# Patient Record
Sex: Female | Born: 1955
Health system: Southern US, Community
[De-identification: ages and names within clinical notes are randomized; demographics above are authoritative.]

## PROBLEM LIST (undated history)

## (undated) DIAGNOSIS — R413 Other amnesia: Secondary | ICD-10-CM

## (undated) DIAGNOSIS — E079 Disorder of thyroid, unspecified: Secondary | ICD-10-CM

## (undated) DIAGNOSIS — E039 Hypothyroidism, unspecified: Secondary | ICD-10-CM

## (undated) DIAGNOSIS — F0390 Unspecified dementia without behavioral disturbance: Secondary | ICD-10-CM

## (undated) HISTORY — PX: BREAST LUMPECTOMY: SHX2

## (undated) HISTORY — DX: Hypothyroidism, unspecified: E03.9

## (undated) HISTORY — DX: Disorder of thyroid, unspecified: E07.9

## (undated) HISTORY — DX: Other amnesia: R41.3

## (undated) HISTORY — DX: Unspecified dementia, unspecified severity, without behavioral disturbance, psychotic disturbance, mood disturbance, and anxiety: F03.90

---

## 1998-01-27 ENCOUNTER — Other Ambulatory Visit: Admission: RE | Admit: 1998-01-27 | Discharge: 1998-01-27 | Payer: Self-pay | Admitting: Obstetrics and Gynecology

## 1999-02-20 ENCOUNTER — Other Ambulatory Visit: Admission: RE | Admit: 1999-02-20 | Discharge: 1999-02-20 | Payer: Self-pay | Admitting: Obstetrics and Gynecology

## 2000-05-07 ENCOUNTER — Other Ambulatory Visit: Admission: RE | Admit: 2000-05-07 | Discharge: 2000-05-07 | Payer: Self-pay | Admitting: Obstetrics and Gynecology

## 2001-04-21 ENCOUNTER — Ambulatory Visit (HOSPITAL_COMMUNITY): Admission: RE | Admit: 2001-04-21 | Discharge: 2001-04-21 | Payer: Self-pay | Admitting: Family Medicine

## 2001-04-21 ENCOUNTER — Encounter: Payer: Self-pay | Admitting: Family Medicine

## 2001-07-08 ENCOUNTER — Other Ambulatory Visit: Admission: RE | Admit: 2001-07-08 | Discharge: 2001-07-08 | Payer: Self-pay | Admitting: Obstetrics and Gynecology

## 2002-05-01 ENCOUNTER — Encounter: Payer: Self-pay | Admitting: Family Medicine

## 2002-05-01 ENCOUNTER — Encounter: Admission: RE | Admit: 2002-05-01 | Discharge: 2002-05-01 | Payer: Self-pay | Admitting: Family Medicine

## 2002-08-18 ENCOUNTER — Other Ambulatory Visit: Admission: RE | Admit: 2002-08-18 | Discharge: 2002-08-18 | Payer: Self-pay | Admitting: Obstetrics and Gynecology

## 2004-01-16 LAB — HM COLONOSCOPY: HM Colonoscopy: NORMAL

## 2005-01-01 ENCOUNTER — Emergency Department (HOSPITAL_COMMUNITY): Admission: EM | Admit: 2005-01-01 | Discharge: 2005-01-01 | Payer: Self-pay | Admitting: Emergency Medicine

## 2005-11-13 ENCOUNTER — Ambulatory Visit: Payer: Self-pay | Admitting: Family Medicine

## 2005-11-13 LAB — CONVERTED CEMR LAB: TSH: 0.75 microintl units/mL (ref 0.35–5.50)

## 2006-02-04 ENCOUNTER — Ambulatory Visit: Payer: Self-pay | Admitting: Family Medicine

## 2006-02-04 LAB — CONVERTED CEMR LAB
Cholesterol: 217 mg/dL (ref 0–200)
Direct LDL: 137.7 mg/dL
HDL: 39.5 mg/dL (ref >39.0)
TSH: 0.36 microintl units/mL (ref 0.35–5.50)
Total CHOL/HDL Ratio: 5.5
Triglycerides: 234 mg/dL (ref 0–149)
VLDL: 47 mg/dL — ABNORMAL HIGH (ref 0–40)

## 2006-04-08 ENCOUNTER — Ambulatory Visit: Payer: Self-pay | Admitting: Family Medicine

## 2006-04-08 LAB — CONVERTED CEMR LAB
ALT: 25 units/L (ref 0–40)
AST: 32 units/L (ref 0–37)
Cholesterol: 217 mg/dL (ref 0–200)
Direct LDL: 142.9 mg/dL
HDL: 44 mg/dL (ref >39.0)
Total CHOL/HDL Ratio: 4.9
Triglycerides: 184 mg/dL — ABNORMAL HIGH (ref 0–149)
VLDL: 37 mg/dL (ref 0–40)

## 2007-03-03 ENCOUNTER — Ambulatory Visit: Payer: Self-pay | Admitting: Family Medicine

## 2007-03-03 DIAGNOSIS — M810 Age-related osteoporosis without current pathological fracture: Secondary | ICD-10-CM

## 2007-03-03 DIAGNOSIS — M6530 Trigger finger, unspecified finger: Secondary | ICD-10-CM | POA: Insufficient documentation

## 2007-03-03 DIAGNOSIS — E039 Hypothyroidism, unspecified: Secondary | ICD-10-CM | POA: Insufficient documentation

## 2007-03-04 ENCOUNTER — Ambulatory Visit: Payer: Self-pay | Admitting: Family Medicine

## 2007-03-05 ENCOUNTER — Encounter (INDEPENDENT_AMBULATORY_CARE_PROVIDER_SITE_OTHER): Payer: Self-pay | Admitting: *Deleted

## 2007-03-05 LAB — CONVERTED CEMR LAB: TSH: 0.52 microintl units/mL (ref 0.35–5.50)

## 2007-03-07 ENCOUNTER — Encounter (INDEPENDENT_AMBULATORY_CARE_PROVIDER_SITE_OTHER): Payer: Self-pay | Admitting: Family Medicine

## 2007-03-31 ENCOUNTER — Telehealth (INDEPENDENT_AMBULATORY_CARE_PROVIDER_SITE_OTHER): Payer: Self-pay | Admitting: *Deleted

## 2007-04-04 ENCOUNTER — Encounter: Payer: Self-pay | Admitting: Internal Medicine

## 2007-07-01 ENCOUNTER — Ambulatory Visit (HOSPITAL_BASED_OUTPATIENT_CLINIC_OR_DEPARTMENT_OTHER): Admission: RE | Admit: 2007-07-01 | Discharge: 2007-07-01 | Payer: Self-pay | Admitting: Orthopedic Surgery

## 2007-09-01 ENCOUNTER — Encounter: Payer: Self-pay | Admitting: Family Medicine

## 2008-04-14 ENCOUNTER — Telehealth (INDEPENDENT_AMBULATORY_CARE_PROVIDER_SITE_OTHER): Payer: Self-pay | Admitting: *Deleted

## 2008-09-29 LAB — CONVERTED CEMR LAB: Pap Smear: NORMAL

## 2008-09-29 LAB — HM MAMMOGRAPHY

## 2009-05-09 ENCOUNTER — Telehealth (INDEPENDENT_AMBULATORY_CARE_PROVIDER_SITE_OTHER): Payer: Self-pay | Admitting: *Deleted

## 2009-05-24 ENCOUNTER — Ambulatory Visit: Payer: Self-pay | Admitting: Family Medicine

## 2009-05-30 LAB — CONVERTED CEMR LAB
ALT: 21 units/L (ref 0–35)
BUN: 19 mg/dL (ref 6–23)
Bilirubin, Direct: 0 mg/dL (ref 0.0–0.3)
CO2: 31 meq/L (ref 19–32)
Chloride: 107 meq/L (ref 96–112)
Cholesterol: 250 mg/dL — ABNORMAL HIGH (ref 0–200)
Creatinine, Ser: 0.7 mg/dL (ref 0.4–1.2)
Eosinophils Absolute: 0.2 10*3/uL (ref 0.0–0.7)
Eosinophils Relative: 2.3 % (ref 0.0–5.0)
Free T4: 0.8 ng/dL (ref 0.6–1.6)
Glucose, Bld: 100 mg/dL — ABNORMAL HIGH (ref 70–99)
HCT: 43.4 % (ref 36.0–46.0)
Lymphs Abs: 2.8 10*3/uL (ref 0.7–4.0)
MCHC: 34.8 g/dL (ref 30.0–36.0)
MCV: 94 fL (ref 78.0–100.0)
Monocytes Absolute: 0.6 10*3/uL (ref 0.1–1.0)
Neutrophils Relative %: 54.3 % (ref 43.0–77.0)
Platelets: 254 10*3/uL (ref 150.0–400.0)
Potassium: 5.1 meq/L (ref 3.5–5.1)
RDW: 12.8 % (ref 11.5–14.6)
T3, Free: 2.4 pg/mL (ref 2.3–4.2)
TSH: 1.59 microintl units/mL (ref 0.35–5.50)
Total Bilirubin: 0.5 mg/dL (ref 0.3–1.2)
Total Protein: 6.9 g/dL (ref 6.0–8.3)
VLDL: 38.6 mg/dL (ref 0.0–40.0)
WBC: 7.9 10*3/uL (ref 4.5–10.5)

## 2010-02-16 NOTE — Progress Notes (Signed)
  Phone Note Call from Patient   Caller: Patient Call For: Loreen Freud DO Summary of Call: Spoke with pt and she is going to make appt. Refilled medication for 1 month. Army Fossa CMA  May 09, 2009 4:19 PM     New/Updated Medications: SYNTHROID 100 MCG  TABS (LEVOTHYROXINE SODIUM) Take one tablet daily Prescriptions: SYNTHROID 100 MCG  TABS (LEVOTHYROXINE SODIUM) Take one tablet daily  #30 x 0   Entered by:   Army Fossa CMA   Authorized by:   Loreen Freud DO   Signed by:   Army Fossa CMA on 05/09/2009   Method used:   Electronically to        [facility] [address] (retail)       [phone]       [place], [place]  [address]       Ph: [phone]       Fax: [phone]   RxID:   [id number]

## 2010-02-16 NOTE — Assessment & Plan Note (Signed)
Summary: rto & lab/cbs   Vital Signs:  Patient profile:   55 year old female Height:      61 inches Weight:      135.50 pounds BMI:     25.70 Pulse rate:   76 / minute Pulse rhythm:   regular BP sitting:   118 / 80  (left arm) Cuff size:   regular  Vitals Entered By: Army Fossa CMA (May 24, 2009 9:29 AM) CC: Pt here for follow up and labs   History of Present Illness: Pt here for refill of synthroid only.  NO complaints.  Pt sees Gyn for pap, mammo and bmd.  Preventive Screening-Counseling & Management  Alcohol-Tobacco     Alcohol type: OCASSIONALLY     Smoking Status: never  Caffeine-Diet-Exercise     Does Patient Exercise: yes     Type of exercise: JAZZER SIZE,WEIGHT TRAINING     Times/week: 4  Current Medications (verified): 1)  Evista 60 Mg  Tabs (Raloxifene Hcl) .Marland Kitchen.. 1 By Mouth Once Daily 2)  Fosamax 70 Mg  Tabs (Alendronate Sodium) .Marland Kitchen.. 1 By Mouth Weekly 3)  Synthroid 100 Mcg  Tabs (Levothyroxine Sodium) .... Take One Tablet Daily 4)  Fish Oil 5)  Vitamin D 1000 Unit Tabs (Cholecalciferol)  Allergies (verified): No Known Drug Allergies  Past History:  Past medical, surgical, family and social histories (including risk factors) reviewed for relevance to current acute and chronic problems.  Past Medical History: Reviewed history from 03/03/2007 and no changes required. Hypothyroidism Osteoporosis  Past Surgical History: Reviewed history from 03/03/2007 and no changes required. Caesarean section Lumpectomy  Family History: Reviewed history and no changes required.  Social History: Reviewed history from 03/03/2007 and no changes required. Never Smoked Alcohol use-yes Drug use-no Regular exercise-yes  Review of Systems      See HPI  Physical Exam  General:  Well-developed,well-nourished,in no acute distress; alert,appropriate and cooperative throughout examination Neck:  No deformities, masses, or tenderness noted. Lungs:  Normal  respiratory effort, chest expands symmetrically. Lungs are clear to auscultation, no crackles or wheezes. Heart:  normal rate and no murmur.   Psych:  Oriented X3 and normally interactive.     Impression & Recommendations:  Problem # 1:  HYPOTHYROIDISM (ICD-244.9)  Her updated medication list for this problem includes:    Synthroid 100 Mcg Tabs (Levothyroxine sodium) .Marland Kitchen... Take one tablet daily  Orders: Venipuncture (86578) TLB-Lipid Panel (80061-LIPID) TLB-BMP (Basic Metabolic Panel-BMET) (80048-METABOL) TLB-CBC Platelet - w/Differential (85025-CBCD) TLB-Hepatic/Liver Function Pnl (80076-HEPATIC) TLB-TSH (Thyroid Stimulating Hormone) (84443-TSH) TLB-T3, Free (Triiodothyronine) (84481-T3FREE) TLB-T4 (Thyrox), Free [phone])  Labs Reviewed: TSH: 0.52 (03/04/2007)    Chol: 217 (04/08/2006)   HDL: 44.0 (04/08/2006)   LDL: DEL (04/08/2006)   TG: 184 (04/08/2006)  Complete Medication List: 1)  Evista 60 Mg Tabs (Raloxifene hcl) .Marland Kitchen.. 1 by mouth once daily 2)  Fosamax 70 Mg Tabs (Alendronate sodium) .Marland Kitchen.. 1 by mouth weekly 3)  Synthroid 100 Mcg Tabs (Levothyroxine sodium) .... Take one tablet daily 4)  Fish Oil  5)  Vitamin D 1000 Unit Tabs (Cholecalciferol)  Patient Instructions: 1)  Return for physical  Prescriptions: SYNTHROID 100 MCG  TABS (LEVOTHYROXINE SODIUM) Take one tablet daily Brand medically necessary #30 x 11   Entered and Authorized by:   Loreen Freud DO   Signed by:   Loreen Freud DO on 05/24/2009   Method used:   Print then Give to Patient   RxID:   [id number]   Last Colonoscopy:  normal (01/16/2004  4:23:17 PM) Colonoscopy Result Date:  01/16/2004 Colonoscopy Result:  normal Colonoscopy Next Due:  10 yr PAP Result Date:  09/29/2008 PAP Result:  normal PAP Next Due:  1 yr Mammogram Result Date:  09/29/2008 Mammogram Result:  normal Mammogram Next Due:  1 yr

## 2010-02-21 ENCOUNTER — Encounter: Payer: Self-pay | Admitting: Family Medicine

## 2010-03-02 ENCOUNTER — Telehealth (INDEPENDENT_AMBULATORY_CARE_PROVIDER_SITE_OTHER): Payer: Self-pay | Admitting: *Deleted

## 2010-03-08 NOTE — Progress Notes (Signed)
Summary: Bone Density Results  Phone Note Outgoing Call Call back at Home Phone [phone]   Call placed by: Shonna Chock CMA,  March 02, 2010 4:19 PM Call placed to: Patient Summary of Call: Spoke with patient, all information below ok'd  osteopenic----but much better can stop fosamax ---con't evista and calcium with vita d---recheck 2 years .Shonna Chock CMA  March 02, 2010 4:20 PM

## 2010-05-30 NOTE — Op Note (Signed)
Jennifer Small, Jennifer Small               ACCOUNT NO.:  [id number]   MEDICAL RECORD NO.:  [id number]          PATIENT TYPE:  AMB   LOCATION:  [facility]                          FACILITY:  [facility]   PHYSICIAN:  Cindee Salt, M.D.       DATE OF BIRTH:  [birth year]   DATE OF PROCEDURE:  06/19/2007  DATE OF DISCHARGE:                               OPERATIVE REPORT   PREOPERATIVE DIAGNOSIS:  Stenosing tenosynovitis, right thumb.   POSTOPERATIVE DIAGNOSIS:  Stenosing tenosynovitis, right thumb.   OPERATION:  Release A1 pulley, right thumb.   SURGEON:  Cindee Salt, MD   ASSISTANT:  Carolyne Fiscal, RN   ANESTHESIA:  Forearm-based IV regional.   DATE OF OPERATION:  July 01, 2007.   ANESTHESIOLOGIST:  Zenon Mayo, MD.   HISTORY:  The patient is a 55 year old female with a history of  triggering of her right thumb.  This has not responded to conservative  treatment.  She has elected to undergo release of this.  She is aware of  risks and complications including infection, recurrence, injury to  arteries, nerves, tendons, incomplete relief of symptoms, and dystrophy.  Preoperative area, the patient is seen.  The extremity marked by both  the patient and surgeon.  Antibiotic given.   PROCEDURE:  The patient is brought to the operating room where a forearm-  based IV regional anesthetic was carried out without difficulty along  with sedation.  She was prepped using DuraPrep, supine position, right  arm free.  After time-out was performed, a transverse incision was made  over the A1 pulley of the right thumb, carried down through subcutaneous  tissue.  Bleeders were electrocauterized.  Retractors placed protecting  the radial and ulnar digital nerves to the radial side of the A1 pulley  and an incision was made.  The oblique pulley was left intact.  Pulley  was found to be thickened.  No further lesions were identified.  Thumb  placed through full range motion.  No further triggering was evident.  The  wound was irrigated.  Skin closed with interrupted 4-0 Vicryl Rapide  sutures.  A  sterile compressive dressing was applied.  The patient tolerated the  procedure well and was taken to the recovery room for observation in  satisfactory condition.  She will be discharged to home to return to the  [facility] in 1 week on Vicodin.           ______________________________  Cindee Salt, M.D.     GK/MEDQ  D:  07/01/2007  T:  07/01/2007  Job:  [id number]   cc:   Leanne Chang, M.D.

## 2010-06-01 ENCOUNTER — Other Ambulatory Visit: Payer: Self-pay | Admitting: Family Medicine

## 2010-07-08 ENCOUNTER — Encounter: Payer: Self-pay | Admitting: Family Medicine

## 2010-07-13 ENCOUNTER — Ambulatory Visit (INDEPENDENT_AMBULATORY_CARE_PROVIDER_SITE_OTHER): Payer: BC Managed Care – PPO | Admitting: Family Medicine

## 2010-07-13 ENCOUNTER — Encounter: Payer: Self-pay | Admitting: Family Medicine

## 2010-07-13 VITALS — BP 122/70 | HR 75 | Temp 98.7°F | Wt 136.0 lb

## 2010-07-13 DIAGNOSIS — E039 Hypothyroidism, unspecified: Secondary | ICD-10-CM

## 2010-07-13 LAB — TSH: TSH: 3.79 u[IU]/mL (ref 0.35–5.50)

## 2010-07-13 LAB — T4, FREE: Free T4: 0.8 ng/dL (ref 0.60–1.60)

## 2010-07-13 MED ORDER — SYNTHROID 100 MCG PO TABS: 100.0000 ug | ORAL_TABLET | Freq: Every day | ORAL | Status: DC

## 2010-07-13 NOTE — Progress Notes (Signed)
Addended by: Legrand Como on: 07/13/2010 10:31 AM   Modules accepted: Orders

## 2010-07-13 NOTE — Patient Instructions (Signed)
Hypothyroidism The thyroid is a large gland located in the lower front of your neck. The thyroid gland helps control metabolism. Metabolism is how your body handles food. It controls metabolism with the hormone thyroxine. When this gland is underactive (hypothyroid), it produces too little hormone.  SYMPTOMS OF HYPOTHYROIDISM  Lethargy (feeling as though you have no energy)   Cold intolerance   Weight gain (in spite of normal food intake)   Dry skin   Coarse hair   Menstrual irregularity (if severe, may lead to infertility)   Slowing of thought processes  Cardiac problems are also caused by insufficient amounts of thyroid hormone. Hypothyroidism in the newborn is cretinism, and is an extreme form. It is important that this form be treated adequately and immediately or it will lead rapidly to retarded physical and mental development. CAUSES OF HYPOTHYROIDISM These include:   Absence or destruction of thyroid tissue.  Goiter due to iodine deficiency.   Goiter due to medications.   Congenital defects (since birth).  Problems with the pituitary. This causes a lack of TSH (thyroid stimulating hormone). This hormone tells the thyroid to turn out more hormone.   DIAGNOSIS To prove hypothyroidism, your caregiver may do blood tests and ultrasound tests. Sometimes the signs are hidden. It may be necessary for your caregiver to watch this illness with blood tests either before or after diagnosis and treatment. TREATMENT  Low levels of thyroid hormone are increased by using synthetic thyroid hormone. This is a safe, effective treatment. It usually takes about four weeks to gain the full effects of the medication. After you have the full effect of the medication, it will generally take another four weeks for problems to leave. Your caregiver may start you on low doses. If you have had heart problems the dose may be gradually increased. It is generally not an emergency to get rapidly to  normal. HOME CARE INSTRUCTIONS  Take your medications as your caregiver suggests. Let your caregiver know of any medications you are taking or start taking. Your caregiver will help you with dosage schedules.   As your condition improves, your dosage needs may increase. It will be necessary to have continuing blood tests as suggested by your caregiver.   Report all suspected medication side effects to your caregiver.  SEEK MEDICAL CARE IF YOU DEVELOP:  Sweating.  Tremulousness (tremors).   Anxiety.   Rapid weight loss.   Heat intolerance.  Emotional swings.   Diarrhea.   Weakness.   SEEK IMMEDIATE MEDICAL CARE IF: You develop chest pain, an irregular heart beat (palpitations), or a rapid heart beat. MAKE SURE YOU:   Understand these instructions.   Will watch your condition.   Will get help right away if you are not doing well or get worse.  Document Released: 01/01/2005 Document Re-Released: 12/15/2007 [employer] Patient Information 2011 [employer]. 

## 2010-07-13 NOTE — Progress Notes (Signed)
  Subjective:     Jennifer Small is a 55 y.o. female who presents for follow up of hypothyroidism. Current symptoms: none. Patient denies change in energy level, diarrhea, heat / cold intolerance, nervousness, palpitations and weight changes. Symptoms have been well-controlled.  The following portions of the patient's history were reviewed and updated as appropriate: allergies, current medications, past family history, past medical history, past social history, past surgical history and problem list.  Review of Systems Pertinent items are noted in HPI.    Objective:    BP 122/70  Pulse 75  Temp(Src) 98.7 F (37.1 C) (Oral)  Wt 136 lb (61.689 kg)  SpO2 98% General appearance: alert, cooperative, appears stated age and no distress Neck: no adenopathy, supple, symmetrical, trachea midline and thyroid not enlarged, symmetric, no tenderness/mass/nodules Lungs: clear to auscultation bilaterally Heart: regular rate and rhythm, S1, S2 normal, no murmur, click, rub or gallop Extremities: extremities normal, atraumatic, no cyanosis or edema  Laboratory: Lab Results  Component Value Date   TSH 1.59 05/24/2009      Assessment:    Hypothyroidism.  Replacement.     Plan:    1. L-thyroxine per orders. 2. Recheck thyroid function tests  today. 3. Instructed not to take multivitamins or iron within 4 hours of taking thyroid medications. 4. Follow up in 1 year.

## 2010-08-13 ENCOUNTER — Other Ambulatory Visit: Payer: Self-pay | Admitting: Family Medicine

## 2010-08-14 NOTE — Telephone Encounter (Signed)
30 Day supply

## 2010-09-26 ENCOUNTER — Ambulatory Visit (INDEPENDENT_AMBULATORY_CARE_PROVIDER_SITE_OTHER): Payer: BC Managed Care – PPO | Admitting: Family Medicine

## 2010-09-26 ENCOUNTER — Encounter: Payer: Self-pay | Admitting: Family Medicine

## 2010-09-26 VITALS — BP 122/80 | HR 91 | Temp 98.8°F | Ht 62.0 in | Wt 134.8 lb

## 2010-09-26 DIAGNOSIS — Z0000 Encounter for general adult medical examination without abnormal findings: Secondary | ICD-10-CM

## 2010-09-26 DIAGNOSIS — F411 Generalized anxiety disorder: Secondary | ICD-10-CM

## 2010-09-26 DIAGNOSIS — F419 Anxiety disorder, unspecified: Secondary | ICD-10-CM

## 2010-09-26 MED ORDER — ESCITALOPRAM OXALATE 10 MG PO TABS: 10.0000 mg | ORAL_TABLET | Freq: Every day | ORAL | Status: DC

## 2010-09-26 NOTE — Patient Instructions (Signed)
Anxiety and Panic Attacks Your caregiver has informed you that you are having an anxiety or panic attack. There may be many forms of this. Most of the time these attacks come suddenly and without warning. They come at any time of day, including periods of sleep, and at any time of life. They may be strong and unexplained. Although panic attacks are very scary, they are physically harmless. Sometimes the cause of your anxiety is not known. Anxiety is a protective mechanism of the body in its fight or flight mechanism. Most of these perceived danger situations are actually nonphysical situations (such as anxiety over losing a job). CAUSES The causes of an anxiety or panic attack are many. Panic attacks may occur in otherwise healthy people given a certain set of circumstances. There may be a genetic cause for panic attacks. Some medications may also have anxiety as a side effect. SYMPTOMS Some of the most common feelings are:  Intense terror.  Dizziness, feeling faint.   Hot and cold flashes.   Fear of going crazy.   Feelings that nothing is real.   Sweating.   Shaking.   Chest pain or a fast heartbeat (palpitations).  Smothering, choking sensations.   Feelings of impending doom and that death is near.   Tingling of extremities, this may be from over breathing.   Altered reality (derealization).   Being detached from yourself (depersonalization).   Several symptoms can be present to make up anxiety or panic attacks. DIAGNOSIS The evaluation by your caregiver will depend on the type of symptoms you are experiencing. The diagnosis of anxiety or pain attack is made when no physical illness can be determined to be a cause of the symptoms. TREATMENT Treatment to prevent anxiety and panic attacks may include:  Avoidance of circumstances that cause anxiety.   Reassurance and relaxation.   Regular exercise.   Relaxation therapies, such as yoga.   Psychotherapy with a psychiatrist  or therapist.   Avoidance of caffeine, alcohol and illegal drugs.   Prescribed medication.  SEEK IMMEDIATE MEDICAL CARE IF:  You experience panic attack symptoms that are different than your usual symptoms.   You have any worsening or concerning symptoms.  Document Released: 01/01/2005 Document Re-Released: 06/21/2009 Exit[employer] Patient Information 2011 [employer].Preventative Care for Adults - Female Studies show that half of deaths in the [place] today result from unhealthy lifestyle practices. This includes ignoring preventive care suggestions. Preventive health guidelines for women include the following key practices:  A routine yearly physical is a good way to check with your primary caregiver about your health and preventive screening. It is a chance to share any concerns and updates on your health, and to receive a thorough all-over exam.   If you smoke cigarettes, find out from your caregiver how to quit. It can literally save your life, no matter how long you have been a tobacco user. If you do not use tobacco, never start.   Maintain a healthy diet and normal weight. Increased weight leads to problems with blood pressure and diabetes. Decrease saturated fat in your diet and increase regular exercise. Eat a variety of foods, including fruit, vegetables, animal or vegetable protein (meat, fish, chicken, and eggs, or beans, lentils, and tofu), and grains, such as rice. Get information about proper diet from your caregiver, if needed.   Aerobic exercise helps maintain good heart health. The CDC and the American College of Sports Medicine recommend 30 minutes of moderate-intensity exercise (a brisk walk that increases   your heart rate and breathing) on most days of the week. Ongoing high blood pressure should be treated with medicines, if weight loss and exercise are not effective.   Avoid smoking, drinking too much alcohol (more than two drinks per day), and use of street drugs.  Do not share needles with anyone. Ask for professional help if you need support or instructions about stopping the use of alcohol, cigarettes, or drugs.   Maintain normal blood lipids and cholesterol, by minimizing your intake of saturated fat. Eat a well rounded diet, with plenty of fruit and vegetables. The National Institutes of Health encourage women to eat 5-9 servings of fruit and vegetables each day. Your caregiver can give instructions to help you keep your risk of heart disease or stroke low. High blood pressure causes heart disease and increases risk of stroke. Blood pressure should be checked every 1-2 years, from age 20 onward.   Blood tests for high cholesterol, which causes heart and vessel disease, should begin at age 20 and be repeated every 5 years, if test results are normal. (Repeat tests more often if results are high.)   Diabetes screening involves taking a blood sample to check your blood sugar level, after a fasting period. This is done once every 3 years, after age 45, if test results are normal.   Breast cancer screening is essential to preventive care for women. All women age 20 and older should perform a breast self-exam every month. At age 40 and older, women should have their caregiver complete a breast exam each year. Women at ages 40-50 should have a mammogram (x-ray film) of the breasts each year. Your caregiver can discuss when to start your yearly mammograms.   Cervical cancer screening includes taking a Pap smear (sample of cells examined under a microscope) from the cervix (end of the uterus). It also includes testing for HPV (Human Papilloma Virus, which can cause cervical cancer). Screening and a pelvic exam should begin at age 21, or 3 years after a woman becomes sexually active. Screening should occur every year, with a Pap smear but no HPV testing, up to age 30. After age 30, you should have a Pap smear every 3 years with HPV testing, if no HPV was found previously.    Colon cancer can be detected, and often prevented, long before it is life threatening. Most routine colon cancer screening begins at the age of 50. On a yearly basis, your caregiver may provide easy-to-use take-home tests to check for hidden blood in the stool. Use of a small camera at the end of a tube, to directly examine the colon (sigmoidoscopy or colonoscopy), can detect the earliest forms of colon cancer and can be life saving. Talk to your caregiver about this at age 50, when routine screening begins. (Screening is repeated every 5 years, unless early forms of pre-cancerous polyps or small growths are found.)   Practice safe sex. Use condoms. Condoms are used for birth control and to reduce the spread of sexually transmitted infections (STIs). Unsafe sex is sexual activity without the use of safeguards, such as condoms and avoidance of high-risk acts, to reduce the chances of getting or spreading STIs. STIs include gonorrhea (the clap), chlamydia, syphilis, trichimonas, herpes, HPV (human papilloma virus) and HIV (human immunodeficiency virus), which causes AIDS. Herpes, HIV, and HPV are viral illnesses that have no cure. They can result in disability, cancer, and death.   HPV causes cancer of the cervix, and other infections that can   be transmitted from person to person. There is a vaccine for HPV, and females should get immunized between the ages of 11 and 26. It requires a series of 3 shots.   Osteoporosis is a disease in which the bones lose minerals and strength as we age. This can result in serious bone fractures. Risk of osteoporosis can be identified using a bone density scan. Women ages 65 and over should discuss this with their caregivers, as should women after menopause who have other risk factors. Ask your caregiver whether you should be taking a calcium supplement and Vitamin D, to reduce the rate of osteoporosis.   Menopause can be associated with physical symptoms and risks. Hormone  replacement therapy is available to decrease these. You should talk to your caregiver about whether starting or continuing to take hormones is right for you.   Use sunscreen with SPF (skin protection factor) of 15 or more. Apply sunscreen liberally and repeatedly throughout the day. Being outside in the sun, when your shadow is shorter than you are, means you are being exposed to sun at greater intensity. Lighter skinned people are at a greater risk of skin cancer. Wear sunglasses, to protect your eyes from too much damaging sunlight (which can speed up cataract formation).   Once a month, do a whole body skin exam or review, using a mirror to look at your back. Notify your caregiver of changes in moles, especially if there are changes in shapes, colors, irregular border, a size larger than a pencil eraser, or new moles develop.   Keep carbon monoxide and smoke detectors in your home, and functioning, at all times. Change the batteries every 6 months, or use a model that plugs into the wall.   Stay up to date with your tetanus shots and other required immunizations. A booster for tetanus should be given every 10 years. Be sure to get your flu shot every year, since 5%-20% of the [place] population comes down with the flu. The composition of the flu vaccine changes each year, so being vaccinated once is not enough. Get your shot in the fall, before the flu season peaks. The table below lists important vaccines to get. Other vaccines to consider include for Hepatitis A virus (to prevent a form of infection of the liver, by a virus acquired from food), Varicella Zoster (a virus that causes shingles), and Meninogoccal (against bacteria which cause a form of meningitis).   Brush your teeth twice a day with fluoride toothpaste, and floss once a day. Good oral hygiene prevents tooth decay and gum disease, which can be painful and can cause other health problems. Visit your dentist for a routine oral and dental check  up and preventive care every 6-12 months.   The Body Mass Index or BMI is a way of measuring how much of your body is fat. Having a BMI above 27 increases the risk of heart disease, diabetes, hypertension, stroke and other problems related to obesity. Your caregiver can help determine your BMI, and can develop an exercise and dietary program to help you achieve or maintain this measurement at a healthy level.   Wear seat belts whenever you are in a vehicle, whether as passenger or driver, and even for short drives of a few minutes.   If you bicycle, wear a helmet at all times.  Preventative Care for Adult Women  Preventative Services Ages 19-39 Ages 40-64 Ages 65 and over  Health risk assessment and lifestyle counseling.       Blood pressure check.** Every 1-2 years Every 1-2 years Every 1-2 years  Total cholesterol check including HDL.** Every 5 years beginning at age 35 Every 5 years beginning at age 20, or more often if risk is high Every 5 years through age 75, then optional  Breast self exam. Monthly in all women ages 20 and older Monthly Monthly  Clinical breast exam.** Every 3 years beginning at age 20 Every year Every year  Mammogram.**  Every year beginning at age 50, optional from age 40-49 (discuss with your caregiver). Every year until age 75, then optional  Pap Smear** and HPV Screening. Every year from ages 21 through 29 Every 3 years from ages 30 through 65, if HPV is negative Optional; talk with your caregiver  Flexible sigmoidoscopy** or colonoscopy.**   Every 5 years beginning at age 50 Every 5 years until age 80; then optional  FOBT (fecal occult blood test) of stool.  Every year beginning at age 50 Every year until 80; then optional  Skin self-exam. Monthly Monthly Monthly  Tetanus-diphtheria (Td) immunization. Every 10 years Every 10 years Every 10 years  Influenza immunization.** Every year Every year Every year  HPV immunization. Once between the ages of 11 and 26       Pneumococcal immunization.** Optional Optional Every 5 years  Hepatitis B immunization.** Series of 3 immunizations  (if not done previously, usually given at 0, 1 to 2, and 4 to 6 months)  Check with your caregiver, if vaccination not previously given Check with your caregiver, if vaccination not previously given  ** Family history and personal history of risk and conditions may change your caregiver's recommendations.  Document Released: 02/27/2001 Document Re-Released: 03/28/2009 Exit[employer] Patient Information 2011 [employer]. 

## 2010-09-26 NOTE — Progress Notes (Signed)
Subjective:     Jennifer Small is a 55 y.o. female and is here for a comprehensive physical exam. The patient reports problems - anxiety.  Pt states she was anxious when her husband stayed with his brother after a heart attack.  Pt states she is having a harder time dealing with things and getting things done.  She was constantly worried.    History   Social History  . Marital Status: Married    Spouse Name: N/A    Number of Children: N/A  . Years of Education: N/A   Occupational History  .  [employer]   Social History Main Topics  . Smoking status: Never Smoker   . Smokeless tobacco: Never Used  . Alcohol Use: Yes     rare  . Drug Use: No  . Sexually Active: Yes -- Female partner(s)   Other Topics Concern  . Not on file   Social History Narrative  . No narrative on file   Health Maintenance  Topic Date Due  . Mammogram  09/30/2010  . Influenza Vaccine  10/16/2010  . Pap Smear  12/25/2012  . Colonoscopy  01/15/2014  . Tetanus/tdap  07/13/2019    The following portions of the patient's history were reviewed and updated as appropriate: allergies, current medications, past family history, past medical history, past social history, past surgical history and problem list.  Review of Systems Review of Systems  Constitutional: Negative for activity change, appetite change and fatigue.  HENT: Negative for hearing loss, congestion, tinnitus and ear discharge.  dentist q30m Eyes: Negative for visual disturbance (see optho q1y -- vision corrected to 20/20 with glasses).  Respiratory: Negative for cough, chest tightness and shortness of breath.   Cardiovascular: Negative for chest pain, palpitations and leg swelling.  Gastrointestinal: Negative for abdominal pain, diarrhea, constipation and abdominal distention.  Genitourinary: Negative for urgency, frequency, decreased urine volume and difficulty urinating.  Musculoskeletal: Negative for back pain, arthralgias and gait  problem.  Skin: Negative for color change, pallor and rash.  Neurological: Negative for dizziness, light-headedness, numbness and headaches.  Hematological: Negative for adenopathy. Does not bruise/bleed easily.  Psychiatric/Behavioral: Negative for suicidal ideas, confusion, sleep disturbance, self-injury, dysphoric mood, decreased concentration and agitation. + anxiety     Objective:    BP 122/80  Pulse 91  Temp(Src) 98.8 F (37.1 C) (Oral)  Ht 5\' 2"  (1.575 m)  Wt 134 lb 12.8 oz (61.145 kg)  BMI 24.66 kg/m2  SpO2 98% General appearance: alert, cooperative, appears stated age and no distress Head: Normocephalic, without obvious abnormality, atraumatic Eyes: conjunctivae/corneas clear. PERRL, EOM&#39;s intact. Fundi benign. Ears: normal TM's and external ear canals both ears Nose: Nares normal. Septum midline. Mucosa normal. No drainage or sinus tenderness. Throat: lips, mucosa, and tongue normal; teeth and gums normal Neck: no adenopathy, no carotid bruit, no JVD, supple, symmetrical, trachea midline and thyroid not enlarged, symmetric, no tenderness/mass/nodules Back: symmetric, no curvature. ROM normal. No CVA tenderness. Lungs: clear to auscultation bilaterally Breasts: normal appearance, no masses or tenderness Heart: regular rate and rhythm, S1, S2 normal, no murmur, click, rub or gallop Abdomen: soft, non-tender; bowel sounds normal; no masses,  no organomegaly Pelvic: deferred until dec Extremities: extremities normal, atraumatic, no cyanosis or edema Pulses: 2+ and symmetric Skin: Skin color, texture, turgor normal. No rashes or lesions Lymph nodes: Cervical, supraclavicular, and axillary nodes normal. Neurologic: Alert and oriented X 3, normal strength and tone. Normal symmetric reflexes. Normal coordination and gait   psych-- +  fidgety and anxious Assessment:    Healthy female exam.  Anxiety--- start Lexapro 10 mg qd  osteoporosis   Plan:  ghm utd Pap not due  until dec Schedule mammo rto 1 month or sooner prn   See After Visit Summary for Counseling Recommendations

## 2010-10-12 LAB — POCT HEMOGLOBIN-HEMACUE: Hemoglobin: 16 — ABNORMAL HIGH

## 2010-10-26 ENCOUNTER — Encounter: Payer: Self-pay | Admitting: Family Medicine

## 2010-10-26 ENCOUNTER — Ambulatory Visit: Payer: BC Managed Care – PPO | Admitting: Family Medicine

## 2010-10-26 DIAGNOSIS — Z0289 Encounter for other administrative examinations: Secondary | ICD-10-CM

## 2011-01-15 ENCOUNTER — Encounter: Payer: Self-pay | Admitting: Family Medicine

## 2011-01-24 ENCOUNTER — Other Ambulatory Visit: Payer: Self-pay | Admitting: Family Medicine

## 2011-02-24 ENCOUNTER — Other Ambulatory Visit: Payer: Self-pay | Admitting: Family Medicine

## 2011-04-11 ENCOUNTER — Other Ambulatory Visit: Payer: Self-pay

## 2011-04-11 MED ORDER — RALOXIFENE HCL 60 MG PO TABS: 60.0000 mg | ORAL_TABLET | Freq: Every day | ORAL | Status: DC

## 2011-06-18 ENCOUNTER — Encounter: Payer: Self-pay | Admitting: Family Medicine

## 2011-06-18 ENCOUNTER — Ambulatory Visit (INDEPENDENT_AMBULATORY_CARE_PROVIDER_SITE_OTHER): Payer: BC Managed Care – PPO | Admitting: Family Medicine

## 2011-06-18 VITALS — BP 122/80 | HR 93 | Temp 98.0°F | Ht 61.0 in | Wt 143.8 lb

## 2011-06-18 DIAGNOSIS — R413 Other amnesia: Secondary | ICD-10-CM

## 2011-06-18 DIAGNOSIS — Z0000 Encounter for general adult medical examination without abnormal findings: Secondary | ICD-10-CM

## 2011-06-18 DIAGNOSIS — F0390 Unspecified dementia without behavioral disturbance: Secondary | ICD-10-CM

## 2011-06-18 NOTE — Patient Instructions (Signed)
Dementia  Dementia is a general term for problems with brain function. A person with dementia has memory loss and a hard time with at least one other brain function such as thinking, speaking, or problem solving. Dementia can affect social functioning, how you do your job, your mood, or your personality. The changes may be hidden for a long time. The earliest forms of this disease are usually not detected by family or friends.  Dementia can be:   Irreversible.   Potentially reversible.   Partially reversible.   Progressive. This means it can get worse over time.  CAUSES   Irreversible dementia causes may include:   Degeneration of brain cells (Alzheimer's disease or lewy body dementia).   Multiple small strokes (vascular dementia).   Infection (chronic meningitis or Creutzfelt-Jakob disease).   Frontotemporal dementia. This affects younger people, age 40 to 70, compared to those who have Alzheimer's disease.   Dementia associated with other disorders like Parkinson's disease, Huntington's disease, or HIV-associated dementia.  Potentially or partially reversible dementia causes may include:   Medicines.   Metabolic causes such as excessive alcohol intake, vitamin B12 deficiency, or thyroid disease.   Masses or pressure in the brain such as a tumor, blood clot, or hydrocephalus.  SYMPTOMS   Symptoms are often hard to detect. Family members or coworkers may not notice them early in the disease process. Different people with dementia may have different symptoms. Symptoms can include:   A hard time with memory, especially recent memory. Long-term memory may not be impaired.   Asking the same question multiple times or forgetting something someone just said.   A hard time speaking your thoughts or finding certain words.   A hard time solving problems or performing familiar tasks (such as how to use a telephone).   Sudden changes in mood.   Changes in personality, especially increasing moodiness or  mistrust.   Depression.   A hard time understanding complex ideas that were never a problem in the past.  DIAGNOSIS   There are no specific tests for dementia.    Your caregiver may recommend a thorough evaluation. This is because some forms of dementia can be reversible. The evaluation will likely include a physical exam and getting a detailed history from you and a family member. The history often gives the best clues and suggestions for a diagnosis.   Memory testing may be done. A detailed brain function evaluation called neuropsychologic testing may be helpful.   Lab tests and brain imaging (such as a CT scan or MRI scan) are sometimes important.   Sometimes observation and re-evaluation over time is very helpful.  TREATMENT   Treatment depends on the cause.    If the problem is a vitamin deficiency, it may be helped or cured with supplements.   For dementias such as Alzheimer's disease, medicines are available to stabilize or slow the course of the disease. There are no cures for this type of dementia.   Your caregiver can help direct you to groups, organizations, and other caregivers to help with decisions in the care of you or your loved one.  HOME CARE INSTRUCTIONS  The care of individuals with dementia is varied and dependent upon the progression of the dementia. The following suggestions are intended for the person living with, or caring for, the person with dementia.   Create a safe environment.   Remove the locks on bathroom doors to prevent the person from accidentally locking himself or herself in.     Use childproof latches on kitchen cabinets and any place where cleaning supplies, chemicals, or alcohol are kept.   Use childproof covers in unused electrical outlets.   Install childproof devices to keep doors and windows secured.   Remove stove knobs or install safety knobs and an automatic shut-off on the stove.   Lower the temperature on water heaters.   Label medicines and keep them  locked up.   Secure knives, lighters, matches, power tools, and guns, and keep these items out of reach.   Keep the house free from clutter. Remove rugs or anything that might contribute to a fall.   Remove objects that might break and hurt the person.   Make sure lighting is good, both inside and outside.   Install grab rails as needed.   Use a monitoring device to alert you to falls or other needs for help.   Reduce confusion.   Keep familiar objects and people around.   Use night lights or dim lights at night.   Label items or areas.   Use reminders, notes, or directions for daily activities or tasks.   Keep a simple, consistent routine for waking, meals, bathing, dressing, and bedtime.   Create a calm, quiet environment.   Place large clocks and calendars prominently.   Display emergency numbers and home address near all telephones.   Use cues to establish different times of the day. An example is to open curtains to let the natural light in during the day.    Use effective communication.   Choose simple words and short sentences.   Use a gentle, calm tone of voice.   Be careful not to interrupt.   If the person is struggling to find a word or communicate a thought, try to provide the word or thought.   Ask one question at a time. Allow the person ample time to answer questions. Repeat the question again if the person does not respond.   Reduce nighttime restlessness.   Provide a comfortable bed.   Have a consistent nighttime routine.   Ensure a regular walking or physical activity schedule. Involve the person in daily activities as much as possible.   Limit napping during the day.   Limit caffeine.   Attend social events that stimulate rather than overwhelm the senses.   Encourage good nutrition and hydration.   Reduce distractions during meal times and snacks.   Avoid foods that are too hot or too cold.   Monitor chewing and swallowing ability.   Continue with routine vision,  hearing, dental, and medical screenings.   Only give over-the-counter or prescription medicines as directed by the caregiver.   Monitor driving abilities. Do not allow the person to drive when safe driving is no longer possible.   Register with an identification program which could provide location assistance in the event of a missing person situation.  SEEK MEDICAL CARE IF:    New behavioral problems start such as moodiness, aggressiveness, or seeing things that are not there (hallucinations).   Any new problem with brain function happens. This includes problems with balance, speech, or falling a lot.   Problems with swallowing develop.   Any symptoms of other illness happen.  Small changes or worsening in any aspect of brain function can be a sign that the illness is getting worse. It can also be a sign of another medical illness such as infection. Seeing a caregiver right away is important.  SEEK IMMEDIATE MEDICAL CARE IF:      A fever develops.   New or worsened confusion develops.   New or worsened sleepiness develops.   Staying awake becomes hard to do.  Document Released: 06/27/2000 Document Revised: 12/21/2010 Document Reviewed: 05/29/2010  Exit[employer] Patient Information 2012 [employer].

## 2011-06-18 NOTE — Progress Notes (Signed)
  Subjective:    Patient ID: Jennifer Small, female    DOB: Apr 02, 1955, 56 y.o.   MRN: [id number]  HPI  Pt is here with her husband who is concerned because she has been having memory issues the last 1 -  1 1/2 years.  It seems to be progressively worse.    Review of Systems    as above Objective:   Physical Exam  Constitutional: She appears well-developed. No distress.  Neck: Normal range of motion. Neck supple. No thyromegaly present.  Cardiovascular: Normal rate, regular rhythm and normal heart sounds.   Pulmonary/Chest: Effort normal and breath sounds normal. No respiratory distress. She has no wheezes. She has no rales. She exhibits no tenderness.  Abdominal: Soft. Bowel sounds are normal.  Musculoskeletal: She exhibits no edema and no tenderness.  Lymphadenopathy:    She has no cervical adenopathy.  Neurological: She is alert.       MMSE  21/30  Skin: She is not diaphoretic.  Psychiatric: She has a normal mood and affect. Her behavior is normal.          Assessment & Plan:

## 2011-06-18 NOTE — Assessment & Plan Note (Signed)
Check labs Check MRI Consider Neuro

## 2011-06-19 LAB — BASIC METABOLIC PANEL
Calcium: 9.8 mg/dL (ref 8.4–10.5)
Chloride: 107 mEq/L (ref 96–112)
Creatinine, Ser: 0.9 mg/dL (ref 0.4–1.2)
GFR: 70.07 mL/min (ref >60.00)

## 2011-06-19 LAB — HEPATIC FUNCTION PANEL
ALT: 33 U/L (ref 0–35)
AST: 40 U/L — ABNORMAL HIGH (ref 0–37)
Albumin: 4.2 g/dL (ref 3.5–5.2)
Alkaline Phosphatase: 88 U/L (ref 39–117)
Total Protein: 7.7 g/dL (ref 6.0–8.3)

## 2011-06-19 LAB — LIPID PANEL
Cholesterol: 285 mg/dL — ABNORMAL HIGH (ref 0–200)
VLDL: 44.8 mg/dL — ABNORMAL HIGH (ref 0.0–40.0)

## 2011-06-19 LAB — TSH: TSH: 14.7 u[IU]/mL — ABNORMAL HIGH (ref 0.35–5.50)

## 2011-06-19 NOTE — Progress Notes (Signed)
Addended by: Silvio Pate D on: 06/19/2011 08:46 AM   Modules accepted: Orders

## 2011-06-20 NOTE — Progress Notes (Signed)
Addended by: Silvio Pate D on: 06/20/2011 03:22 PM   Modules accepted: Orders

## 2011-06-21 LAB — CBC WITH DIFFERENTIAL/PLATELET
Eosinophils Absolute: 0.2 10*3/uL (ref 0.0–0.7)
HCT: 45.1 % (ref 36.0–46.0)
Lymphs Abs: 3.5 10*3/uL (ref 0.7–4.0)
MCHC: 33.5 g/dL (ref 30.0–36.0)
MCV: 96.1 fl (ref 78.0–100.0)
Monocytes Absolute: 0.6 10*3/uL (ref 0.1–1.0)
Neutrophils Relative %: 55.1 % (ref 43.0–77.0)
Platelets: 242 10*3/uL (ref 150.0–400.0)

## 2011-06-21 LAB — BASIC METABOLIC PANEL
BUN: 16 mg/dL (ref 6–23)
Calcium: 9.5 mg/dL (ref 8.4–10.5)
GFR: 67.41 mL/min (ref >60.00)
Potassium: 4 mEq/L (ref 3.5–5.1)
Sodium: 142 mEq/L (ref 135–145)

## 2011-07-04 DIAGNOSIS — E039 Hypothyroidism, unspecified: Secondary | ICD-10-CM

## 2011-07-04 MED ORDER — LEVOTHYROXINE SODIUM 112 MCG PO TABS
112.0000 ug | ORAL_TABLET | Freq: Every day | ORAL | Status: DC
Start: 2011-07-04 — End: 2011-10-02

## 2011-07-04 MED ORDER — ATORVASTATIN CALCIUM 10 MG PO TABS: 10.0000 mg | ORAL_TABLET | Freq: Every day | ORAL | Status: DC

## 2011-07-07 ENCOUNTER — Ambulatory Visit (HOSPITAL_BASED_OUTPATIENT_CLINIC_OR_DEPARTMENT_OTHER)
Admission: RE | Admit: 2011-07-07 | Discharge: 2011-07-07 | Disposition: A | Discharge status: Home / Self Care | Payer: BC Managed Care – PPO | Source: Ambulatory Visit | Attending: Family Medicine | Admitting: Family Medicine

## 2011-07-07 DIAGNOSIS — J329 Chronic sinusitis, unspecified: Secondary | ICD-10-CM | POA: Insufficient documentation

## 2011-07-07 DIAGNOSIS — F0390 Unspecified dementia without behavioral disturbance: Secondary | ICD-10-CM | POA: Insufficient documentation

## 2011-07-25 ENCOUNTER — Ambulatory Visit: Payer: BC Managed Care – PPO | Admitting: Family Medicine

## 2011-07-31 ENCOUNTER — Ambulatory Visit: Payer: BC Managed Care – PPO | Admitting: Family Medicine

## 2011-08-03 ENCOUNTER — Encounter: Payer: Self-pay | Admitting: Family Medicine

## 2011-08-03 ENCOUNTER — Ambulatory Visit (INDEPENDENT_AMBULATORY_CARE_PROVIDER_SITE_OTHER): Payer: BC Managed Care – PPO | Admitting: Family Medicine

## 2011-08-03 VITALS — BP 120/80 | HR 102 | Temp 98.4°F | Wt 142.6 lb

## 2011-08-03 DIAGNOSIS — E039 Hypothyroidism, unspecified: Secondary | ICD-10-CM

## 2011-08-03 DIAGNOSIS — E785 Hyperlipidemia, unspecified: Secondary | ICD-10-CM

## 2011-08-03 NOTE — Progress Notes (Signed)
  Subjective:    Patient ID: Jennifer Small, female    DOB: [birth year], 55 y.o.   MRN: [id number]  HPI Pt here with her husband.  She is doing a little better but still is having issues per her husband. Pt is frustrated because she doesn't realize there is a problem.     Review of Systems As above    Objective:   Physical Exam  Constitutional: She appears well-developed and well-nourished.  Neurological: She is alert.  Psychiatric: She has a normal mood and affect. Her behavior is normal. Judgment and thought content normal.          Assessment & Plan:  Memory loss---  ? Secondary to thyroid---  Recheck TSH in 1 month Hyperlipidemia---on  lipitor

## 2011-09-03 ENCOUNTER — Other Ambulatory Visit (INDEPENDENT_AMBULATORY_CARE_PROVIDER_SITE_OTHER): Payer: BC Managed Care – PPO

## 2011-09-03 DIAGNOSIS — E039 Hypothyroidism, unspecified: Secondary | ICD-10-CM

## 2011-09-03 DIAGNOSIS — E785 Hyperlipidemia, unspecified: Secondary | ICD-10-CM

## 2011-09-03 LAB — LIPID PANEL
Cholesterol: 193 mg/dL (ref 0–200)
LDL Cholesterol: 106 mg/dL — ABNORMAL HIGH (ref 0–99)
Total CHOL/HDL Ratio: 4
Triglycerides: 189 mg/dL — ABNORMAL HIGH (ref 0.0–149.0)
VLDL: 37.8 mg/dL (ref 0.0–40.0)

## 2011-09-03 LAB — HEPATIC FUNCTION PANEL
Albumin: 4.2 g/dL (ref 3.5–5.2)
Alkaline Phosphatase: 80 U/L (ref 39–117)
Total Protein: 7.1 g/dL (ref 6.0–8.3)

## 2011-09-07 ENCOUNTER — Ambulatory Visit: Payer: BC Managed Care – PPO | Admitting: Family Medicine

## 2011-09-18 ENCOUNTER — Encounter: Payer: Self-pay | Admitting: Family Medicine

## 2011-09-18 ENCOUNTER — Ambulatory Visit (INDEPENDENT_AMBULATORY_CARE_PROVIDER_SITE_OTHER): Payer: BC Managed Care – PPO | Admitting: Family Medicine

## 2011-09-18 VITALS — BP 120/76 | HR 87 | Temp 98.4°F | Wt 143.4 lb

## 2011-09-18 DIAGNOSIS — E039 Hypothyroidism, unspecified: Secondary | ICD-10-CM

## 2011-09-18 DIAGNOSIS — E785 Hyperlipidemia, unspecified: Secondary | ICD-10-CM | POA: Insufficient documentation

## 2011-09-18 DIAGNOSIS — R413 Other amnesia: Secondary | ICD-10-CM

## 2011-09-18 MED ORDER — DONEPEZIL HCL 5 MG PO TABS: 5.0000 mg | ORAL_TABLET | Freq: Every evening | ORAL | Status: DC | PRN

## 2011-09-18 MED ORDER — FENOFIBRATE 160 MG PO TABS: 160.0000 mg | ORAL_TABLET | Freq: Every day | ORAL | Status: DC

## 2011-09-18 NOTE — Patient Instructions (Addendum)
Dementia Dementia is a general term for problems with brain function. A person with dementia has memory loss and a hard time with at least one other brain function such as thinking, speaking, or problem solving. Dementia can affect social functioning, how you do your job, your mood, or your personality. The changes may be hidden for a long time. The earliest forms of this disease are usually not detected by family or friends. Dementia can be:  Irreversible.   Potentially reversible.   Partially reversible.   Progressive. This means it can get worse over time.  CAUSES  Irreversible dementia causes may include:  Degeneration of brain cells (Alzheimer's disease or lewy body dementia).   Multiple small strokes (vascular dementia).   Infection (chronic meningitis or Creutzfelt-Jakob disease).   Frontotemporal dementia. This affects younger people, age 35 to 71, compared to those who have Alzheimer's disease.   Dementia associated with other disorders like Parkinson's disease, Huntington's disease, or HIV-associated dementia.  Potentially or partially reversible dementia causes may include:  Medicines.   Metabolic causes such as excessive alcohol intake, vitamin B12 deficiency, or thyroid disease.   Masses or pressure in the brain such as a tumor, blood clot, or hydrocephalus.  SYMPTOMS  Symptoms are often hard to detect. Family members or coworkers may not notice them early in the disease process. Different people with dementia may have different symptoms. Symptoms can include:  A hard time with memory, especially recent memory. Long-term memory may not be impaired.   Asking the same question multiple times or forgetting something someone just said.   A hard time speaking your thoughts or finding certain words.   A hard time solving problems or performing familiar tasks (such as how to use a telephone).   Sudden changes in mood.   Changes in personality, especially increasing  moodiness or mistrust.   Depression.   A hard time understanding complex ideas that were never a problem in the past.  DIAGNOSIS  There are no specific tests for dementia.   Your caregiver may recommend a thorough evaluation. This is because some forms of dementia can be reversible. The evaluation will likely include a physical exam and getting a detailed history from you and a family member. The history often gives the best clues and suggestions for a diagnosis.   Memory testing may be done. A detailed brain function evaluation called neuropsychologic testing may be helpful.   Lab tests and brain imaging (such as a CT scan or MRI scan) are sometimes important.   Sometimes observation and re-evaluation over time is very helpful.  TREATMENT  Treatment depends on the cause.   If the problem is a vitamin deficiency, it may be helped or cured with supplements.   For dementias such as Alzheimer's disease, medicines are available to stabilize or slow the course of the disease. There are no cures for this type of dementia.   Your caregiver can help direct you to groups, organizations, and other caregivers to help with decisions in the care of you or your loved one.  HOME CARE INSTRUCTIONS The care of individuals with dementia is varied and dependent upon the progression of the dementia. The following suggestions are intended for the person living with, or caring for, the person with dementia.  Create a safe environment.   Remove the locks on bathroom doors to prevent the person from accidentally locking himself or herself in.   Use childproof latches on kitchen cabinets and any place where cleaning supplies, chemicals,  or alcohol are kept.   Use childproof covers in unused electrical outlets.   Install childproof devices to keep doors and windows secured.   Remove stove knobs or install safety knobs and an automatic shut-off on the stove.   Lower the temperature on water heaters.    Label medicines and keep them locked up.   Secure knives, lighters, matches, power tools, and guns, and keep these items out of reach.   Keep the house free from clutter. Remove rugs or anything that might contribute to a fall.   Remove objects that might break and hurt the person.   Make sure lighting is good, both inside and outside.   Install grab rails as needed.   Use a monitoring device to alert you to falls or other needs for help.   Reduce confusion.   Keep familiar objects and people around.   Use night lights or dim lights at night.   Label items or areas.   Use reminders, notes, or directions for daily activities or tasks.   Keep a simple, consistent routine for waking, meals, bathing, dressing, and bedtime.   Create a calm, quiet environment.   Place large clocks and calendars prominently.   Display emergency numbers and home address near all telephones.   Use cues to establish different times of the day. An example is to open curtains to let the natural light in during the day.    Use effective communication.   Choose simple words and short sentences.   Use a gentle, calm tone of voice.   Be careful not to interrupt.   If the person is struggling to find a word or communicate a thought, try to provide the word or thought.   Ask one question at a time. Allow the person ample time to answer questions. Repeat the question again if the person does not respond.   Reduce nighttime restlessness.   Provide a comfortable bed.   Have a consistent nighttime routine.   Ensure a regular walking or physical activity schedule. Involve the person in daily activities as much as possible.   Limit napping during the day.   Limit caffeine.   Attend social events that stimulate rather than overwhelm the senses.   Encourage good nutrition and hydration.   Reduce distractions during meal times and snacks.   Avoid foods that are too hot or too cold.    Monitor chewing and swallowing ability.   Continue with routine vision, hearing, dental, and medical screenings.   Only give over-the-counter or prescription medicines as directed by the caregiver.   Monitor driving abilities. Do not allow the person to drive when safe driving is no longer possible.   Register with an identification program which could provide location assistance in the event of a missing person situation.  SEEK MEDICAL CARE IF:   New behavioral problems start such as moodiness, aggressiveness, or seeing things that are not there (hallucinations).   Any new problem with brain function happens. This includes problems with balance, speech, or falling a lot.   Problems with swallowing develop.   Any symptoms of other illness happen.  Small changes or worsening in any aspect of brain function can be a sign that the illness is getting worse. It can also be a sign of another medical illness such as infection. Seeing a caregiver right away is important. SEEK IMMEDIATE MEDICAL CARE IF:   A fever develops.   New or worsened confusion develops.   New or worsened   sleepiness develops.   Staying awake becomes hard to do.  Document Released: 06/27/2000 Document Revised: 12/21/2010 Document Reviewed: 05/29/2010 [facility] Patient Information 2012 [place], [place].  Hypertriglyceridemia  Diet for High blood levels of Triglycerides Most fats in food are triglycerides. Triglycerides in your blood are stored as fat in your body. High levels of triglycerides in your blood may put you at a greater risk for heart disease and stroke.  Normal triglyceride levels are less than 150 mg/dL. Borderline high levels are 150-199 mg/dl. High levels are 200 - 499 mg/dL, and very high triglyceride levels are greater than 500 mg/dL. The decision to treat high triglycerides is generally based on the level. For people with borderline or high triglyceride levels, treatment includes weight loss and  exercise. Drugs are recommended for people with very high triglyceride levels. Many people who need treatment for high triglyceride levels have metabolic syndrome. This syndrome is a collection of disorders that often include: insulin resistance, high blood pressure, blood clotting problems, high cholesterol and triglycerides. TESTING PROCEDURE FOR TRIGLYCERIDES  You should not eat 4 hours before getting your triglycerides measured. The normal range of triglycerides is between 10 and 250 milligrams per deciliter (mg/dl). Some people may have extreme levels (1000 or above), but your triglyceride level may be too high if it is above 150 mg/dl, depending on what other risk factors you have for heart disease.   People with high blood triglycerides may also have high blood cholesterol levels. If you have high blood cholesterol as well as high blood triglycerides, your risk for heart disease is probably greater than if you only had high triglycerides. High blood cholesterol is one of the main risk factors for heart disease.  CHANGING YOUR DIET  Your weight can affect your blood triglyceride level. If you are more than 20% above your ideal body weight, you may be able to lower your blood triglycerides by losing weight. Eating less and exercising regularly is the best way to combat this. Fat provides more calories than any other food. The best way to lose weight is to eat less fat. Only 30% of your total calories should come from fat. Less than 7% of your diet should come from saturated fat. A diet low in fat and saturated fat is the same as a diet to decrease blood cholesterol. By eating a diet lower in fat, you may lose weight, lower your blood cholesterol, and lower your blood triglyceride level.  Eating a diet low in fat, especially saturated fat, may also help you lower your blood triglyceride level. Ask your dietitian to help you figure how much fat you can eat based on the number of calories your caregiver  has prescribed for you.  Exercise, in addition to helping with weight loss may also help lower triglyceride levels.   Alcohol can increase blood triglycerides. You may need to stop drinking alcoholic beverages.   Too much carbohydrate in your diet may also increase your blood triglycerides. Some complex carbohydrates are necessary in your diet. These may include bread, rice, potatoes, other starchy vegetables and cereals.   Reduce "simple" carbohydrates. These may include pure sugars, candy, honey, and jelly without losing other nutrients. If you have the kind of high blood triglycerides that is affected by the amount of carbohydrates in your diet, you will need to eat less sugar and less high-sugar foods. Your caregiver can help you with this.   Adding 2-4 grams of fish oil (EPA+ DHA) may also help lower triglycerides. Speak  with your caregiver before adding any supplements to your regimen.  Following the Diet  Maintain your ideal weight. Your caregivers can help you with a diet. Generally, eating less food and getting more exercise will help you lose weight. Joining a weight control group may also help. Ask your caregivers for a good weight control group in your area.  Eat low-fat foods instead of high-fat foods. This can help you lose weight too.  These foods are lower in fat. Eat MORE of these:   Dried beans, peas, and lentils.   Egg whites.   Low-fat cottage cheese.   Fish.   Lean cuts of meat, such as round, sirloin, rump, and flank (cut extra fat off meat you fix).   Whole grain breads, cereals and pasta.   Skim and nonfat dry milk.   Low-fat yogurt.   Poultry without the skin.   Cheese made with skim or part-skim milk, such as mozzarella, parmesan, farmers', ricotta, or pot cheese.  These are higher fat foods. Eat LESS of these:   Whole milk and foods made from whole milk, such as American, blue, cheddar, monterey jack, and swiss cheese   High-fat meats, such as luncheon  meats, sausages, knockwurst, bratwurst, hot dogs, ribs, corned beef, ground pork, and regular ground beef.   Fried foods.  Limit saturated fats in your diet. Substituting unsaturated fat for saturated fat may decrease your blood triglyceride level. You will need to read package labels to know which products contain saturated fats.  These foods are high in saturated fat. Eat LESS of these:   Fried pork skins.   Whole milk.   Skin and fat from poultry.   Palm oil.   Butter.   Shortening.   Cream cheese.   Tomasa Blase.   Margarines and baked goods made from listed oils.   Vegetable shortenings.   Chitterlings.   Fat from meats.   Coconut oil.   Palm kernel oil.   Lard.   Cream.   Sour cream.   Fatback.   Coffee whiteners and non-dairy creamers made with these oils.   Cheese made from whole milk.  Use unsaturated fats (both polyunsaturated and monounsaturated) moderately. Remember, even though unsaturated fats are better than saturated fats; you still want a diet low in total fat.  These foods are high in unsaturated fat:   Canola oil.   Sunflower oil.   Mayonnaise.   Almonds.   Peanuts.   Pine nuts.   Margarines made with these oils.   Safflower oil.   Olive oil.   Avocados.   Cashews.   Peanut butter.   Sunflower seeds.   Soybean oil.   Peanut oil.   Olives.   Pecans.   Walnuts.   Pumpkin seeds.  Avoid sugar and other high-sugar foods. This will decrease carbohydrates without decreasing other nutrients. Sugar in your food goes rapidly to your blood. When there is excess sugar in your blood, your liver may use it to make more triglycerides. Sugar also contains calories without other important nutrients.  Eat LESS of these:   Sugar, brown sugar, powdered sugar, jam, jelly, preserves, honey, syrup, molasses, pies, candy, cakes, cookies, frosting, pastries, colas, soft drinks, punches, fruit drinks, and regular gelatin.   Avoid alcohol.  Alcohol, even more than sugar, may increase blood triglycerides. In addition, alcohol is high in calories and low in nutrients. Ask for sparkling water, or a diet soft drink instead of an alcoholic beverage.  Suggestions for planning and preparing meals  Bake, broil, grill or roast meats instead of frying.   Remove fat from meats and skin from poultry before cooking.   Add spices, herbs, lemon juice or vinegar to vegetables instead of salt, rich sauces or gravies.   Use a non-stick skillet without fat or use no-stick sprays.   Cool and refrigerate stews and broth. Then remove the hardened fat floating on the surface before serving.   Refrigerate meat drippings and skim off fat to make low-fat gravies.   Serve more fish.   Use less butter, margarine and other high-fat spreads on bread or vegetables.   Use skim or reconstituted non-fat dry milk for cooking.   Cook with low-fat cheeses.   Substitute low-fat yogurt or cottage cheese for all or part of the sour cream in recipes for sauces, dips or congealed salads.   Use half yogurt/half mayonnaise in salad recipes.   Substitute evaporated skim milk for cream. Evaporated skim milk or reconstituted non-fat dry milk can be whipped and substituted for whipped cream in certain recipes.   Choose fresh fruits for dessert instead of high-fat foods such as pies or cakes. Fruits are naturally low in fat.  When Dining Out   Order low-fat appetizers such as fruit or vegetable juice, pasta with vegetables or tomato sauce.   Select clear, rather than cream soups.   Ask that dressings and gravies be served on the side. Then use less of them.   Order foods that are baked, broiled, poached, steamed, stir-fried, or roasted.   Ask for margarine instead of butter, and use only a small amount.   Drink sparkling water, unsweetened tea or coffee, or diet soft drinks instead of alcohol or other sweet beverages.  QUESTIONS AND ANSWERS ABOUT OTHER FATS  IN THE BLOOD: SATURATED FAT, TRANS FAT, AND CHOLESTEROL What is trans fat? Trans fat is a type of fat that is formed when vegetable oil is hardened through a process called hydrogenation. This process helps makes foods more solid, gives them shape, and prolongs their shelf life. Trans fats are also called hydrogenated or partially hydrogenated oils.  What do saturated fat, trans fat, and cholesterol in foods have to do with heart disease? Saturated fat, trans fat, and cholesterol in the diet all raise the level of LDL "bad" cholesterol in the blood. The higher the LDL cholesterol, the greater the risk for coronary heart disease (CHD). Saturated fat and trans fat raise LDL similarly.  What foods contain saturated fat, trans fat, and cholesterol? High amounts of saturated fat are found in animal products, such as fatty cuts of meat, chicken skin, and full-fat dairy products like butter, whole milk, cream, and cheese, and in tropical vegetable oils such as palm, palm kernel, and coconut oil. Trans fat is found in some of the same foods as saturated fat, such as vegetable shortening, some margarines (especially hard or stick margarine), crackers, cookies, baked goods, fried foods, salad dressings, and other processed foods made with partially hydrogenated vegetable oils. Small amounts of trans fat also occur naturally in some animal products, such as milk products, beef, and lamb. Foods high in cholesterol include liver, other organ meats, egg yolks, shrimp, and full-fat dairy products. How can I use the new food label to make heart-healthy food choices? Check the Nutrition Facts panel of the food label. Choose foods lower in saturated fat, trans fat, and cholesterol. For saturated fat and cholesterol, you can also use the Percent Daily Value (%DV): 5% DV or less is low,  and 20% DV or more is high. (There is no %DV for trans fat.) Use the Nutrition Facts panel to choose foods low in saturated fat and  cholesterol, and if the trans fat is not listed, read the ingredients and limit products that list shortening or hydrogenated or partially hydrogenated vegetable oil, which tend to be high in trans fat. POINTS TO REMEMBER: YOU NEED A LITTLE TLC (THERAPEUTIC LIFESTYLE CHANGES)  Discuss your risk for heart disease with your caregivers, and take steps to reduce risk factors.   Change your diet. Choose foods that are low in saturated fat, trans fat, and cholesterol.   Add exercise to your daily routine if it is not already being done. Participate in physical activity of moderate intensity, like brisk walking, for at least 30 minutes on most, and preferably all days of the week. No time? Break the 30 minutes into three, 10-minute segments during the day.   Stop smoking. If you do smoke, contact your caregiver to discuss ways in which they can help you quit.   Do not use street drugs.   Maintain a normal weight.   Maintain a healthy blood pressure.   Keep up with your blood work for checking the fats in your blood as directed by your caregiver.  Document Released: 10/20/2003 Document Revised: 12/21/2010 Document Reviewed: 05/17/2008 [facility] Patient Information 2012 [place], [place].

## 2011-09-18 NOTE — Assessment & Plan Note (Signed)
aricept 5 mg 1 po qd Refer to neuro

## 2011-09-18 NOTE — Assessment & Plan Note (Signed)
con't meds Add fenofibrate rto 3 months

## 2011-09-18 NOTE — Progress Notes (Signed)
  Subjective:    Patient ID: Jennifer Small, female    DOB: [birth year], 56 y.o.   MRN: [id number]  HPI Pt here to review labs.  Husband states there has been no more improvement.   Pt states at end of school year last year she did had a collegue say something to her about her memory.   Review of Systems As above    Objective:   Physical Exam  Constitutional: She appears well-developed and well-nourished.  Psychiatric:       Flat affect Teary eyed when discussing memory Pt doesn't seem to remember any of problems husband is discussing.  She is in denial about problem but can not give explanation.           Assessment & Plan:

## 2011-09-18 NOTE — Assessment & Plan Note (Signed)
tsh normal con't current dose

## 2011-10-02 ENCOUNTER — Other Ambulatory Visit: Payer: Self-pay | Admitting: Family Medicine

## 2011-10-05 ENCOUNTER — Other Ambulatory Visit: Payer: Self-pay | Admitting: Family Medicine

## 2011-10-24 ENCOUNTER — Other Ambulatory Visit: Payer: Self-pay | Admitting: Neurology

## 2011-10-24 DIAGNOSIS — R413 Other amnesia: Secondary | ICD-10-CM

## 2011-10-24 DIAGNOSIS — E039 Hypothyroidism, unspecified: Secondary | ICD-10-CM

## 2011-11-09 ENCOUNTER — Other Ambulatory Visit: Payer: BC Managed Care – PPO

## 2011-11-16 ENCOUNTER — Ambulatory Visit
Admission: RE | Admit: 2011-11-16 | Discharge: 2011-11-16 | Disposition: A | Discharge status: Home / Self Care | Payer: BC Managed Care – PPO | Source: Ambulatory Visit | Attending: Neurology | Admitting: Neurology

## 2011-11-16 VITALS — BP 130/48 | HR 71

## 2011-11-16 DIAGNOSIS — M6530 Trigger finger, unspecified finger: Secondary | ICD-10-CM

## 2011-11-16 DIAGNOSIS — E039 Hypothyroidism, unspecified: Secondary | ICD-10-CM

## 2011-11-16 DIAGNOSIS — E785 Hyperlipidemia, unspecified: Secondary | ICD-10-CM

## 2011-11-16 DIAGNOSIS — R413 Other amnesia: Secondary | ICD-10-CM

## 2011-11-16 DIAGNOSIS — M810 Age-related osteoporosis without current pathological fracture: Secondary | ICD-10-CM

## 2011-11-16 LAB — GLUCOSE, CSF: Glucose, CSF: 67 mg/dL (ref 43–76)

## 2011-11-16 LAB — CSF CELL COUNT WITH DIFFERENTIAL
RBC Count, CSF: 0 cu mm
Tube #: 4
WBC, CSF: 1 cu mm (ref 0–5)

## 2011-11-18 LAB — ANGIOTENSIN CONVERTING ENZYME, CSF: ACE, CSF: 0 U/L (ref <=15)

## 2011-11-19 LAB — CNS IGG SYNTHESIS RATE, CSF+BLOOD
Albumin, CSF: 24.9 mg/dL (ref 8.0–42.0)
IgG Index, CSF: 0.46 (ref <0.66)
IgG, CSF: 2.4 mg/dL (ref 0.8–7.7)
IgG, Serum: 1010 mg/dL (ref 694–1618)
MS CNS IgG Synthesis Rate: -3.5 mg/24 h (ref <=3.3)

## 2011-11-19 LAB — CSF CULTURE W GRAM STAIN: Gram Stain: NONE SEEN

## 2011-12-14 LAB — FUNGUS CULTURE W SMEAR

## 2011-12-25 ENCOUNTER — Other Ambulatory Visit: Payer: Self-pay | Admitting: Family Medicine

## 2012-02-03 ENCOUNTER — Other Ambulatory Visit: Payer: Self-pay | Admitting: Family Medicine

## 2012-02-08 DIAGNOSIS — F0390 Unspecified dementia without behavioral disturbance: Secondary | ICD-10-CM

## 2012-02-17 ENCOUNTER — Other Ambulatory Visit: Payer: Self-pay | Admitting: Family Medicine

## 2012-02-18 NOTE — Telephone Encounter (Signed)
Last seen 09/18/11 and filled 04/11/11 #90 with 2 refills.   Please advise     KP

## 2012-02-24 ENCOUNTER — Other Ambulatory Visit: Payer: Self-pay | Admitting: Family Medicine

## 2012-04-16 ENCOUNTER — Other Ambulatory Visit: Payer: Self-pay | Admitting: Family Medicine

## 2012-04-29 ENCOUNTER — Telehealth: Payer: Self-pay

## 2012-04-29 NOTE — Telephone Encounter (Signed)
Please connect me to Dr. Lendon Colonel.

## 2012-04-29 NOTE — Telephone Encounter (Signed)
Patient husband states Jennifer Small has appt with Dr.Hawkes 06/27/2012 @ 11:00 am. We just want to no if this is ok for her to wait until June for appt ? This is the soonest apt Dr.Hawkes has.

## 2012-04-30 ENCOUNTER — Telehealth: Payer: Self-pay | Admitting: Neurology

## 2012-04-30 MED ORDER — PREDNISONE 20 MG PO TABS: 20.0000 mg | ORAL_TABLET | Freq: Every day | ORAL | Status: DC

## 2012-04-30 NOTE — Telephone Encounter (Signed)
I have called Dr. Nickola Major, Rheumatologist, I have talked with her, I will call in Prednisone 20mg   2 tab po qday x one month, then one tab x one month, then 1/2 tabs qday, follow up afterwards.  Annabelle Harman, give her a follow up appt in one month

## 2012-05-01 ENCOUNTER — Other Ambulatory Visit: Payer: Self-pay | Admitting: Family Medicine

## 2012-05-02 NOTE — Telephone Encounter (Signed)
Refill done.  

## 2012-05-06 NOTE — Telephone Encounter (Signed)
Dr.Yan did Dr. Lendon Colonel give patient sooner apt. ? Or is patient still going in June ? Please advise.

## 2012-05-07 NOTE — Telephone Encounter (Signed)
I have talked with Dr. Lendon Colonel, she is in the process of change practice, her office will contact her for appointment, i have started her on prednsione.

## 2012-05-14 ENCOUNTER — Other Ambulatory Visit: Payer: Self-pay | Admitting: Family Medicine

## 2012-05-21 ENCOUNTER — Other Ambulatory Visit: Payer: Self-pay | Admitting: Family Medicine

## 2012-05-28 ENCOUNTER — Other Ambulatory Visit: Payer: Self-pay | Admitting: *Deleted

## 2012-05-28 DIAGNOSIS — Z0289 Encounter for other administrative examinations: Secondary | ICD-10-CM

## 2012-05-28 MED ORDER — LEVOTHYROXINE SODIUM 112 MCG PO TABS: ORAL_TABLET | ORAL | Status: DC

## 2012-05-28 NOTE — Telephone Encounter (Signed)
Rx sent 

## 2012-06-03 ENCOUNTER — Other Ambulatory Visit: Payer: Self-pay | Admitting: Family Medicine

## 2012-06-11 ENCOUNTER — Other Ambulatory Visit: Payer: Self-pay | Admitting: General Practice

## 2012-06-11 NOTE — Telephone Encounter (Signed)
Aricept last filled on 06-04-12 #30 0 refills. Pt not due for refill until 07-05-12.

## 2012-06-16 ENCOUNTER — Telehealth: Payer: Self-pay | Admitting: Neurology

## 2012-06-16 NOTE — Telephone Encounter (Signed)
Form is ready to p/u.

## 2012-06-17 ENCOUNTER — Encounter: Payer: Self-pay | Admitting: Family Medicine

## 2012-06-17 ENCOUNTER — Ambulatory Visit: Payer: Self-pay | Admitting: Neurology

## 2012-06-20 ENCOUNTER — Other Ambulatory Visit: Payer: Self-pay | Admitting: Family Medicine

## 2012-06-26 ENCOUNTER — Encounter: Payer: Self-pay | Admitting: Family Medicine

## 2012-06-26 ENCOUNTER — Ambulatory Visit (INDEPENDENT_AMBULATORY_CARE_PROVIDER_SITE_OTHER): Payer: BC Managed Care – PPO | Admitting: Family Medicine

## 2012-06-26 ENCOUNTER — Encounter: Payer: Self-pay | Admitting: Neurology

## 2012-06-26 ENCOUNTER — Other Ambulatory Visit (HOSPITAL_COMMUNITY)
Admission: RE | Admit: 2012-06-26 | Discharge: 2012-06-26 | Disposition: A | Discharge status: Home / Self Care | Payer: BC Managed Care – PPO | Source: Ambulatory Visit | Attending: Family Medicine | Admitting: Family Medicine

## 2012-06-26 VITALS — BP 136/80 | HR 85 | Temp 98.2°F | Ht 61.5 in | Wt 149.8 lb

## 2012-06-26 DIAGNOSIS — E2839 Other primary ovarian failure: Secondary | ICD-10-CM

## 2012-06-26 DIAGNOSIS — Z124 Encounter for screening for malignant neoplasm of cervix: Secondary | ICD-10-CM

## 2012-06-26 DIAGNOSIS — E039 Hypothyroidism, unspecified: Secondary | ICD-10-CM

## 2012-06-26 DIAGNOSIS — Z0000 Encounter for general adult medical examination without abnormal findings: Secondary | ICD-10-CM

## 2012-06-26 DIAGNOSIS — Z1151 Encounter for screening for human papillomavirus (HPV): Secondary | ICD-10-CM | POA: Insufficient documentation

## 2012-06-26 DIAGNOSIS — Z01419 Encounter for gynecological examination (general) (routine) without abnormal findings: Secondary | ICD-10-CM | POA: Insufficient documentation

## 2012-06-26 DIAGNOSIS — E785 Hyperlipidemia, unspecified: Secondary | ICD-10-CM

## 2012-06-26 DIAGNOSIS — Z1239 Encounter for other screening for malignant neoplasm of breast: Secondary | ICD-10-CM

## 2012-06-26 DIAGNOSIS — M810 Age-related osteoporosis without current pathological fracture: Secondary | ICD-10-CM

## 2012-06-26 DIAGNOSIS — R319 Hematuria, unspecified: Secondary | ICD-10-CM

## 2012-06-26 LAB — POCT URINALYSIS DIPSTICK
Bilirubin, UA: NEGATIVE
Glucose, UA: NEGATIVE
Leukocytes, UA: NEGATIVE
Nitrite, UA: NEGATIVE

## 2012-06-26 LAB — CBC WITH DIFFERENTIAL/PLATELET
Basophils Absolute: 0 10*3/uL (ref 0.0–0.1)
Eosinophils Absolute: 0.1 10*3/uL (ref 0.0–0.7)
HCT: 43.4 % (ref 36.0–46.0)
Hemoglobin: 14.7 g/dL (ref 12.0–15.0)
Lymphs Abs: 2.6 10*3/uL (ref 0.7–4.0)
MCHC: 33.8 g/dL (ref 30.0–36.0)
Monocytes Relative: 8.2 % (ref 3.0–12.0)
Neutro Abs: 5.7 10*3/uL (ref 1.4–7.7)
RDW: 12.6 % (ref 11.5–14.6)

## 2012-06-26 LAB — BASIC METABOLIC PANEL
CO2: 25 mEq/L (ref 19–32)
Chloride: 110 mEq/L (ref 96–112)
Glucose, Bld: 101 mg/dL — ABNORMAL HIGH (ref 70–99)

## 2012-06-26 LAB — LDL CHOLESTEROL, DIRECT: Direct LDL: 108.8 mg/dL

## 2012-06-26 LAB — TSH: TSH: 0.38 u[IU]/mL (ref 0.35–5.50)

## 2012-06-26 LAB — LIPID PANEL
Cholesterol: 169 mg/dL (ref 0–200)
VLDL: 46 mg/dL — ABNORMAL HIGH (ref 0.0–40.0)

## 2012-06-26 LAB — MICROALBUMIN / CREATININE URINE RATIO: Microalb Creat Ratio: 1.1 mg/g (ref 0.0–30.0)

## 2012-06-26 LAB — HEPATIC FUNCTION PANEL
AST: 31 U/L (ref 0–37)
Albumin: 3.9 g/dL (ref 3.5–5.2)
Total Protein: 6.6 g/dL (ref 6.0–8.3)

## 2012-06-26 NOTE — Addendum Note (Signed)
Addended by: Silvio Pate D on: 06/26/2012 12:14 PM   Modules accepted: Orders

## 2012-06-26 NOTE — Progress Notes (Signed)
Subjective:     Jennifer Small is a 57 y.o. female and is here for a comprehensive physical exam. The patient reports no problems.  History   Social History  . Marital Status: Married    Spouse Name: N/A    Number of Children: N/A  . Years of Education: N/A   Occupational History  .      disability   Social History Main Topics  . Smoking status: Never Smoker   . Smokeless tobacco: Never Used  . Alcohol Use: Yes     Comment: rare  . Drug Use: No  . Sexually Active: Yes -- Female partner(s)   Other Topics Concern  . Not on file   Social History Narrative   GETS REG EXERCISE--walking            Health Maintenance  Topic Date Due  . Influenza Vaccine  09/15/2012  . Pap Smear  12/25/2012  . Mammogram  01/01/2013  . Colonoscopy  01/15/2014  . Tetanus/tdap  07/13/2019    The following portions of the patient's history were reviewed and updated as appropriate:  She  has a past medical history of Thyroid disease and Osteoporosis. She  does not have any pertinent problems on file. She  has past surgical history that includes Cesarean section and Breast lumpectomy. Her family history includes Arthritis in her father and Down syndrome in her brother. She  reports that she has never smoked. She has never used smokeless tobacco. She reports that  drinks alcohol. She reports that she does not use illicit drugs. She has a current medication list which includes the following prescription(s): aspirin, atorvastatin, calcium-vitamin d, cholecalciferol, evista, fenofibrate, fish oil-omega-3 fatty acids, garlic, levothyroxine, multivitamin, vagifem, and divalproex. Current Outpatient Prescriptions on File Prior to Visit  Medication Sig Dispense Refill  . aspirin 81 MG tablet Take 81 mg by mouth daily.        Marland Kitchen atorvastatin (LIPITOR) 10 MG tablet TAKE 1 TABLET BY MOUTH DAILY  30 tablet  0  . Calcium-Vitamin D (CALCET) 150-100 MG-UNIT TABS Take 1 tablet by mouth daily.        .  Cholecalciferol (VITAMIN D3) 1000 UNITS tablet Take 1,000 Units by mouth daily.        Marland Kitchen EVISTA 60 MG tablet TAKE 1 TABLET BY MOUTH DAILY  90 tablet  0  . fenofibrate 160 MG tablet TAKE 1 TABLET BY MOUTH DAILY  90 tablet  0  . fish oil-omega-3 fatty acids 1000 MG capsule Take 1 g by mouth daily.        . Garlic (GARLIQUE) 400 MG TBEC Take 1 tablet by mouth daily.        Marland Kitchen levothyroxine (SYNTHROID) 112 MCG tablet TAKE ONE TABLET BY MOUTH DAILY labs due  30 tablet  2  . Multiple Vitamin (MULTIVITAMIN) tablet Take 1 tablet by mouth daily.        Marland Kitchen VAGIFEM 10 MCG TABS        No current facility-administered medications on file prior to visit.   She has No Known Allergies..  Review of Systems Review of Systems  Constitutional: Negative for activity change, appetite change and fatigue.  HENT: Negative for hearing loss, congestion, tinnitus and ear discharge.  dentist q66m Eyes: Negative for visual disturbance (see optho q2y -- vision corrected to 20/20 with glasses).  Respiratory: Negative for cough, chest tightness and shortness of breath.   Cardiovascular: Negative for chest pain, palpitations and leg swelling.  Gastrointestinal: Negative for  abdominal pain, diarrhea, constipation and abdominal distention.  Genitourinary: Negative for urgency, frequency, decreased urine volume and difficulty urinating.  Musculoskeletal: Negative for back pain, arthralgias and gait problem.  Skin: Negative for color change, pallor and rash.  Neurological: Negative for dizziness, light-headedness, numbness and headaches.  Hematological: Negative for adenopathy. Does not bruise/bleed easily.  Psychiatric/Behavioral: Negative for suicidal ideas, confusion, sleep disturbance, self-injury, dysphoric mood, decreased concentration and agitation.       Objective:    BP 136/80  Pulse 85  Temp(Src) 98.2 F (36.8 C) (Oral)  Ht 5' 1.5" (1.562 m)  Wt 149 lb 12.8 oz (67.949 kg)  BMI 27.85 kg/m2  SpO2  95% General appearance: alert, cooperative, appears stated age and no distress Head: Normocephalic, without obvious abnormality, atraumatic Eyes: conjunctivae/corneas clear. PERRL, EOM's intact. Fundi benign. Ears: normal TM's and external ear canals both ears Nose: Nares normal. Septum midline. Mucosa normal. No drainage or sinus tenderness. Throat: lips, mucosa, and tongue normal; teeth and gums normal Neck: no adenopathy, no carotid bruit, no JVD, supple, symmetrical, trachea midline and thyroid not enlarged, symmetric, no tenderness/mass/nodules Back: symmetric, no curvature. ROM normal. No CVA tenderness. Lungs: clear to auscultation bilaterally Breasts: normal appearance, no masses or tenderness Heart: regular rate and rhythm, S1, S2 normal, no murmur, click, rub or gallop Abdomen: soft, non-tender; bowel sounds normal; no masses,  no organomegaly Pelvic: cervix normal in appearance, external genitalia normal, no adnexal masses or tenderness, no cervical motion tenderness, rectovaginal septum normal, uterus normal size, shape, and consistency and vagina normal without discharge Extremities: extremities normal, atraumatic, no cyanosis or edema Pulses: 2+ and symmetric Skin: Skin color, texture, turgor normal. No rashes or lesions Lymph nodes: Cervical, supraclavicular, and axillary nodes normal. Neurologic: Mental status: alertness: alert, orientation: person, place, answers questions appropriately Cranial nerves: normal Motor: grossly normal Coordination: normal Gait: Normal Psych--flat affect      Assessment:    Healthy female exam.      Plan:    ghm utd Check labs See After Visit Summary for Counseling Recommendations

## 2012-06-26 NOTE — Addendum Note (Signed)
Addended by: Arnette Norris on: 06/26/2012 11:09 AM   Modules accepted: Orders

## 2012-06-26 NOTE — Patient Instructions (Signed)
Preventive Care for Adults, Female A healthy lifestyle and preventive care can promote health and wellness. Preventive health guidelines for women include the following key practices.  A routine yearly physical is a good way to check with your caregiver about your health and preventive screening. It is a chance to share any concerns and updates on your health, and to receive a thorough exam.  Visit your dentist for a routine exam and preventive care every 6 months. Brush your teeth twice a day and floss once a day. Good oral hygiene prevents tooth decay and gum disease.  The frequency of eye exams is based on your age, health, family medical history, use of contact lenses, and other factors. Follow your caregiver's recommendations for frequency of eye exams.  Eat a healthy diet. Foods like vegetables, fruits, whole grains, low-fat dairy products, and lean protein foods contain the nutrients you need without too many calories. Decrease your intake of foods high in solid fats, added sugars, and salt. Eat the right amount of calories for you.Get information about a proper diet from your caregiver, if necessary.  Regular physical exercise is one of the most important things you can do for your health. Most adults should get at least 150 minutes of moderate-intensity exercise (any activity that increases your heart rate and causes you to sweat) each week. In addition, most adults need muscle-strengthening exercises on 2 or more days a week.  Maintain a healthy weight. The body mass index (BMI) is a screening tool to identify possible weight problems. It provides an estimate of body fat based on height and weight. Your caregiver can help determine your BMI, and can help you achieve or maintain a healthy weight.For adults 20 years and older:  A BMI below 18.5 is considered underweight.  A BMI of 18.5 to 24.9 is normal.  A BMI of 25 to 29.9 is considered overweight.  A BMI of 30 and above is  considered obese.  Maintain normal blood lipids and cholesterol levels by exercising and minimizing your intake of saturated fat. Eat a balanced diet with plenty of fruit and vegetables. Blood tests for lipids and cholesterol should begin at age 20 and be repeated every 5 years. If your lipid or cholesterol levels are high, you are over 50, or you are at high risk for heart disease, you may need your cholesterol levels checked more frequently.Ongoing high lipid and cholesterol levels should be treated with medicines if diet and exercise are not effective.  If you smoke, find out from your caregiver how to quit. If you do not use tobacco, do not start.  If you are pregnant, do not drink alcohol. If you are breastfeeding, be very cautious about drinking alcohol. If you are not pregnant and choose to drink alcohol, do not exceed 1 drink per day. One drink is considered to be 12 ounces (355 mL) of beer, 5 ounces (148 mL) of wine, or 1.5 ounces (44 mL) of liquor.  Avoid use of street drugs. Do not share needles with anyone. Ask for help if you need support or instructions about stopping the use of drugs.  High blood pressure causes heart disease and increases the risk of stroke. Your blood pressure should be checked at least every 1 to 2 years. Ongoing high blood pressure should be treated with medicines if weight loss and exercise are not effective.  If you are 55 to 59 years old, ask your caregiver if you should take aspirin to prevent strokes.  Diabetes   screening involves taking a blood sample to check your fasting blood sugar level. This should be done once every 3 years, after age 45, if you are within normal weight and without risk factors for diabetes. Testing should be considered at a younger age or be carried out more frequently if you are overweight and have at least 1 risk factor for diabetes.  Breast cancer screening is essential preventive care for women. You should practice "breast  self-awareness." This means understanding the normal appearance and feel of your breasts and may include breast self-examination. Any changes detected, no matter how small, should be reported to a caregiver. Women in their 20s and 30s should have a clinical breast exam (CBE) by a caregiver as part of a regular health exam every 1 to 3 years. After age 40, women should have a CBE every year. Starting at age 40, women should consider having a mammography (breast X-ray test) every year. Women who have a family history of breast cancer should talk to their caregiver about genetic screening. Women at a high risk of breast cancer should talk to their caregivers about having magnetic resonance imaging (MRI) and a mammography every year.  The Pap test is a screening test for cervical cancer. A Pap test can show cell changes on the cervix that might become cervical cancer if left untreated. A Pap test is a procedure in which cells are obtained and examined from the lower end of the uterus (cervix).  Women should have a Pap test starting at age 21.  Between ages 21 and 29, Pap tests should be repeated every 2 years.  Beginning at age 30, you should have a Pap test every 3 years as long as the past 3 Pap tests have been normal.  Some women have medical problems that increase the chance of getting cervical cancer. Talk to your caregiver about these problems. It is especially important to talk to your caregiver if a new problem develops soon after your last Pap test. In these cases, your caregiver may recommend more frequent screening and Pap tests.  The above recommendations are the same for women who have or have not gotten the vaccine for human papillomavirus (HPV).  If you had a hysterectomy for a problem that was not cancer or a condition that could lead to cancer, then you no longer need Pap tests. Even if you no longer need a Pap test, a regular exam is a good idea to make sure no other problems are  starting.  If you are between ages 65 and 70, and you have had normal Pap tests going back 10 years, you no longer need Pap tests. Even if you no longer need a Pap test, a regular exam is a good idea to make sure no other problems are starting.  If you have had past treatment for cervical cancer or a condition that could lead to cancer, you need Pap tests and screening for cancer for at least 20 years after your treatment.  If Pap tests have been discontinued, risk factors (such as a new sexual partner) need to be reassessed to determine if screening should be resumed.  The HPV test is an additional test that may be used for cervical cancer screening. The HPV test looks for the virus that can cause the cell changes on the cervix. The cells collected during the Pap test can be tested for HPV. The HPV test could be used to screen women aged 30 years and older, and should   be used in women of any age who have unclear Pap test results. After the age of 30, women should have HPV testing at the same frequency as a Pap test.  Colorectal cancer can be detected and often prevented. Most routine colorectal cancer screening begins at the age of 50 and continues through age 75. However, your caregiver may recommend screening at an earlier age if you have risk factors for colon cancer. On a yearly basis, your caregiver may provide home test kits to check for hidden blood in the stool. Use of a small camera at the end of a tube, to directly examine the colon (sigmoidoscopy or colonoscopy), can detect the earliest forms of colorectal cancer. Talk to your caregiver about this at age 50, when routine screening begins. Direct examination of the colon should be repeated every 5 to 10 years through age 75, unless early forms of pre-cancerous polyps or small growths are found.  Hepatitis C blood testing is recommended for all people born from 1945 through 1965 and any individual with known risks for hepatitis C.  Practice  safe sex. Use condoms and avoid high-risk sexual practices to reduce the spread of sexually transmitted infections (STIs). STIs include gonorrhea, chlamydia, syphilis, trichomonas, herpes, HPV, and human immunodeficiency virus (HIV). Herpes, HIV, and HPV are viral illnesses that have no cure. They can result in disability, cancer, and death. Sexually active women aged 25 and younger should be checked for chlamydia. Older women with new or multiple partners should also be tested for chlamydia. Testing for other STIs is recommended if you are sexually active and at increased risk.  Osteoporosis is a disease in which the bones lose minerals and strength with aging. This can result in serious bone fractures. The risk of osteoporosis can be identified using a bone density scan. Women ages 65 and over and women at risk for fractures or osteoporosis should discuss screening with their caregivers. Ask your caregiver whether you should take a calcium supplement or vitamin D to reduce the rate of osteoporosis.  Menopause can be associated with physical symptoms and risks. Hormone replacement therapy is available to decrease symptoms and risks. You should talk to your caregiver about whether hormone replacement therapy is right for you.  Use sunscreen with sun protection factor (SPF) of 30 or more. Apply sunscreen liberally and repeatedly throughout the day. You should seek shade when your shadow is shorter than you. Protect yourself by wearing long sleeves, pants, a wide-brimmed hat, and sunglasses year round, whenever you are outdoors.  Once a month, do a whole body skin exam, using a mirror to look at the skin on your back. Notify your caregiver of new moles, moles that have irregular borders, moles that are larger than a pencil eraser, or moles that have changed in shape or color.  Stay current with required immunizations.  Influenza. You need a dose every fall (or winter). The composition of the flu vaccine  changes each year, so being vaccinated once is not enough.  Pneumococcal polysaccharide. You need 1 to 2 doses if you smoke cigarettes or if you have certain chronic medical conditions. You need 1 dose at age 65 (or older) if you have never been vaccinated.  Tetanus, diphtheria, pertussis (Tdap, Td). Get 1 dose of Tdap vaccine if you are younger than age 65, are over 65 and have contact with an infant, are a healthcare worker, are pregnant, or simply want to be protected from whooping cough. After that, you need a Td   booster dose every 10 years. Consult your caregiver if you have not had at least 3 tetanus and diphtheria-containing shots sometime in your life or have a deep or dirty wound.  HPV. You need this vaccine if you are a woman age 26 or younger. The vaccine is given in 3 doses over 6 months.  Measles, mumps, rubella (MMR). You need at least 1 dose of MMR if you were born in 1957 or later. You may also need a second dose.  Meningococcal. If you are age 19 to 21 and a first-year college student living in a residence hall, or have one of several medical conditions, you need to get vaccinated against meningococcal disease. You may also need additional booster doses.  Zoster (shingles). If you are age 60 or older, you should get this vaccine.  Varicella (chickenpox). If you have never had chickenpox or you were vaccinated but received only 1 dose, talk to your caregiver to find out if you need this vaccine.  Hepatitis A. You need this vaccine if you have a specific risk factor for hepatitis A virus infection or you simply wish to be protected from this disease. The vaccine is usually given as 2 doses, 6 to 18 months apart.  Hepatitis B. You need this vaccine if you have a specific risk factor for hepatitis B virus infection or you simply wish to be protected from this disease. The vaccine is given in 3 doses, usually over 6 months. Preventive Services / Frequency Ages 19 to 39  Blood  pressure check.** / Every 1 to 2 years.  Lipid and cholesterol check.** / Every 5 years beginning at age 20.  Clinical breast exam.** / Every 3 years for women in their 20s and 30s.  Pap test.** / Every 2 years from ages 21 through 29. Every 3 years starting at age 30 through age 65 or 70 with a history of 3 consecutive normal Pap tests.  HPV screening.** / Every 3 years from ages 30 through ages 65 to 70 with a history of 3 consecutive normal Pap tests.  Hepatitis C blood test.** / For any individual with known risks for hepatitis C.  Skin self-exam. / Monthly.  Influenza immunization.** / Every year.  Pneumococcal polysaccharide immunization.** / 1 to 2 doses if you smoke cigarettes or if you have certain chronic medical conditions.  Tetanus, diphtheria, pertussis (Tdap, Td) immunization. / A one-time dose of Tdap vaccine. After that, you need a Td booster dose every 10 years.  HPV immunization. / 3 doses over 6 months, if you are 26 and younger.  Measles, mumps, rubella (MMR) immunization. / You need at least 1 dose of MMR if you were born in 1957 or later. You may also need a second dose.  Meningococcal immunization. / 1 dose if you are age 19 to 21 and a first-year college student living in a residence hall, or have one of several medical conditions, you need to get vaccinated against meningococcal disease. You may also need additional booster doses.  Varicella immunization.** / Consult your caregiver.  Hepatitis A immunization.** / Consult your caregiver. 2 doses, 6 to 18 months apart.  Hepatitis B immunization.** / Consult your caregiver. 3 doses usually over 6 months. Ages 40 to 64  Blood pressure check.** / Every 1 to 2 years.  Lipid and cholesterol check.** / Every 5 years beginning at age 20.  Clinical breast exam.** / Every year after age 40.  Mammogram.** / Every year beginning at age 40   and continuing for as long as you are in good health. Consult with your  caregiver.  Pap test.** / Every 3 years starting at age 30 through age 65 or 70 with a history of 3 consecutive normal Pap tests.  HPV screening.** / Every 3 years from ages 30 through ages 65 to 70 with a history of 3 consecutive normal Pap tests.  Fecal occult blood test (FOBT) of stool. / Every year beginning at age 50 and continuing until age 75. You may not need to do this test if you get a colonoscopy every 10 years.  Flexible sigmoidoscopy or colonoscopy.** / Every 5 years for a flexible sigmoidoscopy or every 10 years for a colonoscopy beginning at age 50 and continuing until age 75.  Hepatitis C blood test.** / For all people born from 1945 through 1965 and any individual with known risks for hepatitis C.  Skin self-exam. / Monthly.  Influenza immunization.** / Every year.  Pneumococcal polysaccharide immunization.** / 1 to 2 doses if you smoke cigarettes or if you have certain chronic medical conditions.  Tetanus, diphtheria, pertussis (Tdap, Td) immunization.** / A one-time dose of Tdap vaccine. After that, you need a Td booster dose every 10 years.  Measles, mumps, rubella (MMR) immunization. / You need at least 1 dose of MMR if you were born in 1957 or later. You may also need a second dose.  Varicella immunization.** / Consult your caregiver.  Meningococcal immunization.** / Consult your caregiver.  Hepatitis A immunization.** / Consult your caregiver. 2 doses, 6 to 18 months apart.  Hepatitis B immunization.** / Consult your caregiver. 3 doses, usually over 6 months. Ages 65 and over  Blood pressure check.** / Every 1 to 2 years.  Lipid and cholesterol check.** / Every 5 years beginning at age 20.  Clinical breast exam.** / Every year after age 40.  Mammogram.** / Every year beginning at age 40 and continuing for as long as you are in good health. Consult with your caregiver.  Pap test.** / Every 3 years starting at age 30 through age 65 or 70 with a 3  consecutive normal Pap tests. Testing can be stopped between 65 and 70 with 3 consecutive normal Pap tests and no abnormal Pap or HPV tests in the past 10 years.  HPV screening.** / Every 3 years from ages 30 through ages 65 or 70 with a history of 3 consecutive normal Pap tests. Testing can be stopped between 65 and 70 with 3 consecutive normal Pap tests and no abnormal Pap or HPV tests in the past 10 years.  Fecal occult blood test (FOBT) of stool. / Every year beginning at age 50 and continuing until age 75. You may not need to do this test if you get a colonoscopy every 10 years.  Flexible sigmoidoscopy or colonoscopy.** / Every 5 years for a flexible sigmoidoscopy or every 10 years for a colonoscopy beginning at age 50 and continuing until age 75.  Hepatitis C blood test.** / For all people born from 1945 through 1965 and any individual with known risks for hepatitis C.  Osteoporosis screening.** / A one-time screening for women ages 65 and over and women at risk for fractures or osteoporosis.  Skin self-exam. / Monthly.  Influenza immunization.** / Every year.  Pneumococcal polysaccharide immunization.** / 1 dose at age 65 (or older) if you have never been vaccinated.  Tetanus, diphtheria, pertussis (Tdap, Td) immunization. / A one-time dose of Tdap vaccine if you are over   65 and have contact with an infant, are a healthcare worker, or simply want to be protected from whooping cough. After that, you need a Td booster dose every 10 years.  Varicella immunization.** / Consult your caregiver.  Meningococcal immunization.** / Consult your caregiver.  Hepatitis A immunization.** / Consult your caregiver. 2 doses, 6 to 18 months apart.  Hepatitis B immunization.** / Check with your caregiver. 3 doses, usually over 6 months. ** Family history and personal history of risk and conditions may change your caregiver's recommendations. Document Released: 02/27/2001 Document Revised: 03/26/2011  Document Reviewed: 05/29/2010 [employer] Patient Information 2014 [employer].  

## 2012-06-26 NOTE — Assessment & Plan Note (Signed)
Check labs 

## 2012-06-26 NOTE — Assessment & Plan Note (Signed)
con't meds Check labs  Check bmd

## 2012-06-27 ENCOUNTER — Ambulatory Visit (INDEPENDENT_AMBULATORY_CARE_PROVIDER_SITE_OTHER): Payer: BC Managed Care – PPO | Admitting: Neurology

## 2012-06-27 ENCOUNTER — Encounter: Payer: Self-pay | Admitting: Neurology

## 2012-06-27 ENCOUNTER — Other Ambulatory Visit: Payer: Self-pay | Admitting: Family Medicine

## 2012-06-27 VITALS — BP 126/64 | HR 91 | Ht 60.5 in | Wt 149.2 lb

## 2012-06-27 DIAGNOSIS — E538 Deficiency of other specified B group vitamins: Secondary | ICD-10-CM

## 2012-06-27 DIAGNOSIS — M810 Age-related osteoporosis without current pathological fracture: Secondary | ICD-10-CM

## 2012-06-27 DIAGNOSIS — E039 Hypothyroidism, unspecified: Secondary | ICD-10-CM

## 2012-06-27 DIAGNOSIS — M6530 Trigger finger, unspecified finger: Secondary | ICD-10-CM

## 2012-06-27 DIAGNOSIS — R413 Other amnesia: Secondary | ICD-10-CM

## 2012-06-27 DIAGNOSIS — E785 Hyperlipidemia, unspecified: Secondary | ICD-10-CM

## 2012-06-27 DIAGNOSIS — E0590 Thyrotoxicosis, unspecified without thyrotoxic crisis or storm: Secondary | ICD-10-CM

## 2012-06-27 LAB — URINE CULTURE: Colony Count: NO GROWTH

## 2012-06-27 NOTE — Progress Notes (Signed)
History of Present Illness: Jennifer Small is a 57 years old right-handed Caucasian female, accompanied by her husband, referred by her primary care physician Dr. Laury Axon for evaluation of memory loss since 2012  She has past medical history of hypothyroidism, on supplement, hyperlipidemia, she had college degree, majored in Chief Financial Officer, she is a Public relations account executive for [employer],  Since 2012, her husband noticed she has confusion episode, confused day of the week, do not know how to set up a clock, anxious, losing confident easily, getting worse over the past year,  Herself denied any significant difficulty, denied difficulty handling her job, but getting very frustrated easily during office visit.  She was diagnosed with hypothyroidism in early 2013, I don't know exact pathology, she was started on thyroid supplement, husband reported mild improvement   There was no significant family history of dementia,   MRI of the brain has demonstrated mild atrophy  On follow up visit, she tends to be very agitated, refuse MMSE exam. CSF showed TP 45, Glucose67, VDRL-, ACE0, OCB-, wbc1/rbc 0,   EEG showed frequent spkie-slow burst, increased apperance during photic stimulation. Hyperventilation also induced prolonged long, abnormal generalied slow, spike slow activites.  I have started her on Depakote xr, she could not tolerate clonazapam, has excessive drowsy.   She was evaluated by Dr Leonides Cave in Early 2013, confirmed global and severe cognitive deterioration marked by impairments in multiple cognitive domains, including orientation, attention, mental processing speed, immediate memory, long term memory, language, visual-spatial oragnization, constructional praxis, and abstract reasoning.  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  Labs showed positive ANA, dsDNA, thyroid globulin antibody, low TSH, normal thyroid peroxidase, negative or normal CBC, CMP, HIV, Lyme, VitD, RPR, ESR,  CPK, Vit B12, CRP. mildly elevated LDL 116, triglyceride 189, normal CBC, vitamin B12 1474, elevated TSH 14.7,  Husband reported previous history of abnormal thyroid functional test, There is no signficant personality change or mood disorder.  UPDATE June 2014: I started her on a trial of prednisone up to 20 mg 2 tablets for one to 2 weeks in April 2014, without significant improvement, she was also evaluated by her rheumatologist Dr. Cheryll Cockayne during that period of time, I do not have the feedback, but per patient, there was no definite connective tissue disease, patient failed to respond to prednisone treatment, it was tapered under the guidance of Dr. Juanetta Gosling in 2-3 weeks.  She now denies gait difficulty, on disability now, but confused easily, only drive very short distance,    Physical Exam  Neck: supple no carotid bruits Respiratory: clear to auscultation bilaterally Cardiovascular: regular rate rhythm  Neurologic Exam  Mental Status: anxious looking female,  She gets very frustrated during MMSE, she is not oriented to time, date, month, she has difficulty copy design, write a full sentence, could not draw a clock face.  Cranial Nerves: CN II-XII pupils were equal round reactive to light.   Extraocular movements were full.  Visual fields were full on confrontational test.  Facial sensation and strength were normal.  Hearing was intact to finger rubbing bilaterally.  Uvula tongue were midline.  Head turning and shoulder shrugging were normal and symmetric.  Tongue protrusion into the cheeks strength were normal.  Motor: Normal tone, bulk, and strength. Sensory: Normal to light touch, pinprick, proprioception, and vibratory sensation. Coordination:  There was no dysmetria noticed. Gait and Station: Narrow based and steady Reflexes: Deep tendon reflexes: normal and symmetric.   Assessment and Plan:  57 years old right-handed Caucasian female, with recent diagnosis of hypothyroidism  presenting with short-term memory trouble since 2012, progressive worsening was elevated inflammatory markers, including positive ANA, double-stranded DNA, thyroglobulin antibody, abnormal EEG  1.  Differentiation diagnosis including central nervous system degenerative disorder, vs. Hashimoto encephalitis  2 she failed to response to a short trial of prednisone treatment, after discussed with her husband, we decided to refer her to [facility] memory clinic.

## 2012-07-02 ENCOUNTER — Other Ambulatory Visit: Payer: Self-pay

## 2012-07-02 MED ORDER — ATORVASTATIN CALCIUM 20 MG PO TABS: 20.0000 mg | ORAL_TABLET | Freq: Every day | ORAL | Status: DC

## 2012-07-02 NOTE — Telephone Encounter (Signed)
Request was for 10 mg, 1 by mouth daily. Per instructions on recent lab visit, patient to increase to 20 mg 1 by mouth daily #30/2 refills

## 2012-07-06 ENCOUNTER — Other Ambulatory Visit: Payer: Self-pay | Admitting: Family Medicine

## 2012-07-08 ENCOUNTER — Other Ambulatory Visit: Payer: Self-pay

## 2012-07-08 DIAGNOSIS — R319 Hematuria, unspecified: Secondary | ICD-10-CM

## 2012-08-03 ENCOUNTER — Other Ambulatory Visit: Payer: Self-pay | Admitting: Family Medicine

## 2012-08-18 ENCOUNTER — Telehealth: Payer: Self-pay | Admitting: Neurology

## 2012-08-20 ENCOUNTER — Telehealth: Payer: Self-pay | Admitting: Neurology

## 2012-08-22 NOTE — Telephone Encounter (Signed)
I spoke with Mr. Jennifer Small. Forms have been sent. He also gave me another for as fors must be completed monthly

## 2012-08-22 NOTE — Telephone Encounter (Signed)
I met with patient's husband. I have forms to complete for future months. I have faxed last form. He has copy. He will come in on Thursday to pick up next form for next month.

## 2012-08-25 ENCOUNTER — Telehealth: Payer: Self-pay | Admitting: Neurology

## 2012-08-29 DIAGNOSIS — Z0289 Encounter for other administrative examinations: Secondary | ICD-10-CM

## 2012-09-12 ENCOUNTER — Telehealth: Payer: Self-pay

## 2012-09-12 NOTE — Telephone Encounter (Signed)
I called and spoke with patient's husband. I am sending forms to Dr. Terrace Arabia for signature today. Due to holiday, expect forms to be available at front desk by Thursday or Friday of next week.

## 2012-09-15 ENCOUNTER — Other Ambulatory Visit: Payer: Self-pay | Admitting: Family Medicine

## 2012-09-16 ENCOUNTER — Telehealth: Payer: Self-pay

## 2012-09-16 NOTE — Telephone Encounter (Signed)
Patient's husband called stating that insurance company told him they did not receive letter with patient's diagnosis.  I called patient's husband back after re-faxing letter and calling to leave message with  retirement to look for fax. I am sending letter and confirmation to family.

## 2012-09-17 ENCOUNTER — Telehealth: Payer: Self-pay | Admitting: Neurology

## 2012-09-17 NOTE — Telephone Encounter (Signed)
See previous call note.

## 2012-09-22 ENCOUNTER — Telehealth: Payer: Self-pay | Admitting: Neurology

## 2012-09-22 NOTE — Telephone Encounter (Signed)
I will have Vikki Ports contact patient tomorrow in regards to the forms.  I have some questions about the forms that I need clarified before calling patient.

## 2012-09-23 NOTE — Telephone Encounter (Signed)
Patient's husband to office to pick up updated disability form. I let him know I can fax it each month. He can call in to make payments.

## 2012-10-11 ENCOUNTER — Other Ambulatory Visit: Payer: Self-pay | Admitting: Family Medicine

## 2012-10-16 ENCOUNTER — Telehealth: Payer: Self-pay

## 2012-10-16 NOTE — Telephone Encounter (Signed)
Patient's husband called about monthly disability form. I will complete all monthly forms tomorrow and have them in for signature. I will call him when it's ready for pick-up, likely next Tuesday or Wednesday.

## 2012-10-21 ENCOUNTER — Telehealth: Payer: Self-pay

## 2012-10-21 NOTE — Telephone Encounter (Signed)
I called and spoke with patient's husband. I let him know his wife's form is complete and will be up front for him to pick up. Also, I asked about his wife following up at [facility]. Husband said that she is fighting that. I let him know that there was an appointment in January 2015 but that is likely taken by now. I urged him to call and schedule and appointment. If she can not be encouraged to go then he can alway cancel the appointment. He asked that I leave the information with the disability form. I let him know I printed a copy of the referral information and that has the contact phone number on it. He will pick up forms today or tomorrow and will make an appointment.  Patient has an appointment next week with Dr. Terrace Arabia, per husband, to see if there is a Plan B. '

## 2012-10-22 DIAGNOSIS — Z0289 Encounter for other administrative examinations: Secondary | ICD-10-CM

## 2012-10-27 ENCOUNTER — Other Ambulatory Visit: Payer: Self-pay | Admitting: Family Medicine

## 2012-10-28 ENCOUNTER — Encounter (INDEPENDENT_AMBULATORY_CARE_PROVIDER_SITE_OTHER): Payer: Self-pay

## 2012-10-28 ENCOUNTER — Ambulatory Visit (INDEPENDENT_AMBULATORY_CARE_PROVIDER_SITE_OTHER): Payer: BC Managed Care – PPO | Admitting: Neurology

## 2012-10-28 ENCOUNTER — Encounter: Payer: Self-pay | Admitting: Neurology

## 2012-10-28 VITALS — BP 120/76 | HR 72 | Ht 60.0 in | Wt 142.0 lb

## 2012-10-28 DIAGNOSIS — E039 Hypothyroidism, unspecified: Secondary | ICD-10-CM

## 2012-10-28 DIAGNOSIS — E785 Hyperlipidemia, unspecified: Secondary | ICD-10-CM

## 2012-10-28 DIAGNOSIS — R413 Other amnesia: Secondary | ICD-10-CM

## 2012-10-28 MED ORDER — MEMANTINE HCL ER 28 MG PO CP24: 28.0000 mg | ORAL_CAPSULE | Freq: Every day | ORAL | Status: DC

## 2012-10-28 NOTE — Progress Notes (Signed)
History of Present Illness: Jennifer Small is a 57 years old right-handed Caucasian female, accompanied by her husband, referred by her primary care physician Dr. Laury Axon for evaluation of memory loss since 2012  She has past medical history of hypothyroidism, on supplement, hyperlipidemia, she had college degree, majored in Chief Financial Officer, she is a Public relations account executive for [employer],  Since 2012, her husband noticed she has confusion episode, confused day of the week, do not know how to set up a clock, anxious, losing confident easily, getting worse over the past year,  Herself denied any significant difficulty, denied difficulty handling her job, but getting very frustrated easily during office visit.  She was diagnosed with hypothyroidism in early 2013, I don't know exact pathology, she was started on thyroid supplement, husband reported mild improvement   There was no significant family history of dementia,   MRI of the brain has demonstrated mild atrophy  On follow up visit, she tends to be very agitated, refuse MMSE exam. CSF showed TP 45, Glucose67, VDRL-, ACE0, OCB-, wbc1/rbc 0,   EEG showed frequent spkie-slow burst, increased apperance during photic stimulation. Hyperventilation also induced prolonged long, abnormal generalied slow, spike slow activites.  I have started her on Depakote xr, she could not tolerate clonazapam, has excessive drowsy.   She was evaluated by Dr Leonides Cave in Early 2013, confirmed global and severe cognitive deterioration marked by impairments in multiple cognitive domains, including orientation, attention, mental processing speed, immediate memory, long term memory, language, visual-spatial oragnization, constructional praxis, and abstract reasoning.  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  Labs showed positive ANA, dsDNA, thyroid globulin antibody,elevated TSH 14.7, normal thyroid peroxidase, negative or normal CBC, CMP, HIV, Lyme, VitD,  RPR, ESR, CPK, Vit B12, CRP. mildly elevated LDL 116, triglyceride 189, normal CBC, vitamin B12 1474,   UPDATE June 2014: I started her on a trial of prednisone up to 20 mg 2 tablets for one to 2 weeks in April 2014, without significant improvement, she was also evaluated by her rheumatologist Dr. Cheryll Cockayne during that period of time, I do not have the feedback, but per patient, there was no definite connective tissue disease, patient failed to respond to prednisone treatment, it was tapered under the guidance of Dr. Juanetta Gosling in 2-3 weeks.  She now denies gait difficulty, on disability now, but confused easily, only drive very short distance,    UPDATE Oct 14th 2014: She is about the same, she is functional at home, she cooks some, she is home with dog, take dog for a walk. She has quit driving, she does not want to go to [facility] for second opinion,  Physical Exam  Neck: supple no carotid bruits Respiratory: clear to auscultation bilaterally Cardiovascular: regular rate rhythm  Neurologic Exam  Mental Status: anxious looking female,   MMSE 20/30, missed 2/3 recalls, could not spell WORLD backward, she could not copy figure Cranial Nerves: CN II-XII pupils were equal round reactive to light.   Extraocular movements were full.  Visual fields were full on confrontational test.  Facial sensation and strength were normal.  Hearing was intact to finger rubbing bilaterally.  Uvula tongue were midline.  Head turning and shoulder shrugging were normal and symmetric.  Tongue protrusion into the cheeks strength were normal.  Motor: Normal tone, bulk, and strength. Sensory: Normal to light touch, pinprick, proprioception, and vibratory sensation. Coordination:  There was no dysmetria noticed. Gait and Station: Narrow based and steady Reflexes: Deep tendon reflexes: normal  and symmetric.   Assessment and Plan:  57 years old right-handed Caucasian female, with  diagnosis of hypothyroidism presenting  with short-term memory trouble since 2012, progressive worsening was elevated inflammatory markers, including positive ANA, double-stranded DNA, thyroglobulin antibody, abnormal EEG she had a trial of prednisone up to 40 mg followed by tapering in April 2014 without significant improvement.  1.  Differentiation diagnosis including central nervous system degenerative disorder, vs. Hashimoto encephalitis  2.  I will repeat EEg. 3.  continue Aricept, add on Namenda 4.  return to clinic in 9 month.

## 2012-11-10 ENCOUNTER — Ambulatory Visit (INDEPENDENT_AMBULATORY_CARE_PROVIDER_SITE_OTHER): Payer: BC Managed Care – PPO | Admitting: Radiology

## 2012-11-10 DIAGNOSIS — E039 Hypothyroidism, unspecified: Secondary | ICD-10-CM

## 2012-11-10 DIAGNOSIS — E785 Hyperlipidemia, unspecified: Secondary | ICD-10-CM

## 2012-11-10 DIAGNOSIS — R413 Other amnesia: Secondary | ICD-10-CM

## 2012-11-10 NOTE — Procedures (Signed)
HISTORY:  57 years old female, presenting with memory loss since 2012, whisper to herself during recording  TECHNIQUE:  16 channel EEG was performed based on standard 10-16 international system. One channel was dedicated to EKG, which has demonstrates normal sinus rhythm of 78 beats per minutes.  Upon awakening, the posterior background activity was dysarryhtmic, in theta range 7Hz , with amplitude of 5 microvoltage, reactive to eye opening and closure.  There are frequent bifrontal muscle artifact  There was no evidence of epilepsy for discharge.  Photic stimulation was performed, which induced a symmetric photic driving.  Hyperventilation was performed, there was no abnormality elicit.  No sleep was achieved.  CONCLUSION: This is an  abnormal EEG.  There is electrodiagnostic evidence of diffuse background slowing, there was no evidence of epileptiform discharges.

## 2012-12-04 ENCOUNTER — Telehealth: Payer: Self-pay

## 2012-12-04 NOTE — Telephone Encounter (Signed)
I called and left VM, Please call medical records if you need me to process monthly disability form for this month. The process is that you call them, make payment and they make sure they have an updated release on file. They then send me a purple slip stating that it is okay to go ahead and process her forms.

## 2012-12-10 ENCOUNTER — Telehealth: Payer: Self-pay | Admitting: Neurology

## 2012-12-10 NOTE — Telephone Encounter (Signed)
Husband said that he is willing to wait until Dr Terrace Arabia returns to go over the results of the EEG report with him

## 2012-12-18 NOTE — Telephone Encounter (Addendum)
I have called her husband Birdia, Jaycox still has good days and bad, overall mildly worsening, EEG showed slowly, still abnormal.  She should continue current medication, and follow up

## 2012-12-19 ENCOUNTER — Telehealth: Payer: Self-pay

## 2012-12-19 NOTE — Telephone Encounter (Signed)
I called and spoke with patient about disability forms. I did not hear from them last month about need for continued disability form completion. Patient stated that husband did come by to pick up forms recently. She said she would have husband call to update on patient status and plan for continued forms.

## 2012-12-31 ENCOUNTER — Other Ambulatory Visit: Payer: Self-pay | Admitting: Neurology

## 2013-01-09 ENCOUNTER — Other Ambulatory Visit: Payer: Self-pay | Admitting: Family Medicine

## 2013-01-22 ENCOUNTER — Other Ambulatory Visit: Payer: Self-pay | Admitting: Family Medicine

## 2013-02-02 ENCOUNTER — Telehealth: Payer: Self-pay

## 2013-02-02 NOTE — Telephone Encounter (Signed)
I called patient's husband and let him know that forms will be up front by noon for pick up.

## 2013-02-07 ENCOUNTER — Other Ambulatory Visit: Payer: Self-pay | Admitting: Family Medicine

## 2013-02-24 ENCOUNTER — Other Ambulatory Visit: Payer: Self-pay | Admitting: Family Medicine

## 2013-03-19 ENCOUNTER — Other Ambulatory Visit: Payer: Self-pay | Admitting: Family Medicine

## 2013-03-27 LAB — HM DEXA SCAN

## 2013-03-27 LAB — HM MAMMOGRAPHY: HM MAMMO: NEGATIVE

## 2013-04-04 ENCOUNTER — Other Ambulatory Visit: Payer: Self-pay | Admitting: Family Medicine

## 2013-04-21 ENCOUNTER — Encounter: Payer: Self-pay | Admitting: Family Medicine

## 2013-05-03 ENCOUNTER — Other Ambulatory Visit: Payer: Self-pay | Admitting: Family Medicine

## 2013-06-13 ENCOUNTER — Other Ambulatory Visit: Payer: Self-pay | Admitting: Family Medicine

## 2013-06-20 ENCOUNTER — Other Ambulatory Visit: Payer: Self-pay | Admitting: Family Medicine

## 2013-07-14 ENCOUNTER — Other Ambulatory Visit: Payer: Self-pay | Admitting: Family Medicine

## 2013-07-27 ENCOUNTER — Other Ambulatory Visit: Payer: Self-pay | Admitting: Neurology

## 2013-07-28 ENCOUNTER — Encounter (INDEPENDENT_AMBULATORY_CARE_PROVIDER_SITE_OTHER): Payer: Self-pay

## 2013-07-28 ENCOUNTER — Encounter: Payer: Self-pay | Admitting: Neurology

## 2013-07-28 ENCOUNTER — Other Ambulatory Visit: Payer: Self-pay | Admitting: Neurology

## 2013-07-28 ENCOUNTER — Ambulatory Visit (INDEPENDENT_AMBULATORY_CARE_PROVIDER_SITE_OTHER): Payer: BC Managed Care – PPO | Admitting: Neurology

## 2013-07-28 VITALS — BP 133/80 | HR 66 | Ht 61.0 in | Wt 144.0 lb

## 2013-07-28 DIAGNOSIS — R413 Other amnesia: Secondary | ICD-10-CM

## 2013-07-28 DIAGNOSIS — M810 Age-related osteoporosis without current pathological fracture: Secondary | ICD-10-CM

## 2013-07-28 DIAGNOSIS — E785 Hyperlipidemia, unspecified: Secondary | ICD-10-CM

## 2013-07-28 DIAGNOSIS — E039 Hypothyroidism, unspecified: Secondary | ICD-10-CM

## 2013-07-28 MED ORDER — DIVALPROEX SODIUM ER 250 MG PO TB24: 500.0000 mg | ORAL_TABLET | Freq: Every day | ORAL | Status: DC

## 2013-07-28 MED ORDER — MEMANTINE HCL-DONEPEZIL HCL ER 28-10 MG PO CP24: 1.0000 | ORAL_CAPSULE | Freq: Every morning | ORAL | Status: DC

## 2013-07-28 NOTE — Progress Notes (Signed)
History of Present Illness: Jennifer Small is a 57 years old right-handed Caucasian female, accompanied by her husband, referred by her primary care physician Dr. Lowne for evaluation of memory loss since 2012  She has past medical history of hypothyroidism, on supplement, hyperlipidemia, she had college degree, majored in marketing, she is a kindergarten teacher's assistant for [employer],  Since 2012, her husband noticed she has confusion episode, confused day of the week, do not know how to set up a clock, anxious, losing confident easily, getting worse over the past year,  Herself denied any significant difficulty, denied difficulty handling her job, but getting very frustrated easily during office visit.  She was diagnosed with hypothyroidism in early 2013, I don't know exact pathology, she was started on thyroid supplement, husband reported mild improvement   There was no significant family history of dementia,   MRI of the brain has demonstrated mild atrophy  On follow up visit, she tends to be very agitated, refuse MMSE exam. CSF showed TP 45, Glucose67, VDRL-, ACE0, OCB-, wbc1/rbc 0,   EEG showed frequent spkie-slow burst, increased apperance during photic stimulation. Hyperventilation also induced prolonged long, abnormal generalied slow, spike slow activites.  I have started her on Depakote xr, she could not tolerate clonazapam, has excessive drowsy.   She was evaluated by Dr Zelson in Early 2013, confirmed global and severe cognitive deterioration marked by impairments in multiple cognitive domains, including orientation, attention, mental processing speed, immediate memory, long term memory, language, visual-spatial oragnization, constructional praxis, and abstract reasoning.  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  Labs showed positive ANA, dsDNA, thyroid globulin antibody,elevated TSH 14.7, normal thyroid peroxidase, negative or normal CBC, CMP, HIV, Lyme, VitD,  RPR, ESR, CPK, Vit B12, CRP. mildly elevated LDL 116, triglyceride 189, normal CBC, vitamin B12 1474,   UPDATE June 2014: I started her on a trial of prednisone up to 20 mg 2 tablets for one to 2 weeks in April 2014, without significant improvement, she was also evaluated by her rheumatologist Dr. Angela Hawkins during that period of time, I do not have the feedback, but per patient, there was no definite connective tissue disease, patient failed to respond to prednisone treatment, it was tapered under the guidance of Dr. Hawkins in 2-3 weeks.  She denies gait difficulty, on disability now, but confused easily, only drive very short distance,    UPDATE Oct 14th 2014: She is about the same, she is functional at home, she cooks some, she is home with dog, take dog for a walk. She has quit driving, she does not want to go to [facility] for second opinion  UPDATE July 14th 2015:  Her husband retired, they are able to spend more time together, travels some, maintain the house, garden, she sleeps well, eats well, no vision difficulty no gait difficulty, no incontinence. She is taking Namenda, Aricept, Depakote ER 250 mg 2 tablets at night  Physical Exam  Neck: supple no carotid bruits Respiratory: clear to auscultation bilaterally Cardiovascular: regular rate rhythm  Neurologic Exam  Mental Status: anxious looking female,   MMSE 14/30, she is not oriented to time and place, has missed 2/3 recalls, could not spell WORLD backward, she could not copy figure nor write sentence Cranial Nerves: CN II-XII pupils were equal round reactive to light.   Extraocular movements were full.  Visual fields were full on confrontational test.  Facial sensation and strength were normal.  Hearing was intact to finger rubbing bilaterally.    Uvula tongue were midline.  Head turning and shoulder shrugging were normal and symmetric.  Tongue protrusion into the cheeks strength were normal.  Motor: Normal tone, bulk, and  strength. Sensory: Normal to light touch, pinprick, proprioception, and vibratory sensation. Coordination:  There was no dysmetria noticed. Gait and Station: Narrow based and steady Reflexes: Deep tendon reflexes: normal and symmetric.   Assessment and Plan:  57 years old right-handed Caucasian female, with  diagnosis of hypothyroidism presenting with short-term memory trouble since 2012, progressive worsening was elevated inflammatory markers, including positive ANA, double-stranded DNA, thyroglobulin antibody, abnormal EEG she had a trial of prednisone up to 40 mg followed by tapering in April 2014 without significant improvement.  1.  Most consistent with central nervous system degenerative disorder  2.   Change her to Namzaric daily, continue Depakote ER 250 mg 2 tablets every night 3.  RTC in one year with Carolyn  

## 2013-08-08 ENCOUNTER — Other Ambulatory Visit: Payer: Self-pay | Admitting: Family Medicine

## 2013-08-11 ENCOUNTER — Telehealth: Payer: Self-pay

## 2013-08-11 NOTE — Telephone Encounter (Signed)
Information below provided by pt's husband, Avanell Shackletonavid Gossen (DPR signed):  Medication and allergies:  Reviewed and updated  90 day supply/mail order: n/a [id number] - [place], [place] - [address]   Immunizations due:  Zoster   A/P: No changes to personal, family history or past surgical hx PAP- 06/26/12- normal CCS- 01/16/04- normal MMG- 03/27/13- negative BD- 03/27/13-negative Tdap- 07/12/09 Shingles- DUE  To Discuss with Provider: Nothing at this time.

## 2013-08-12 ENCOUNTER — Encounter: Payer: Self-pay | Admitting: Family Medicine

## 2013-08-12 ENCOUNTER — Ambulatory Visit (INDEPENDENT_AMBULATORY_CARE_PROVIDER_SITE_OTHER): Payer: BC Managed Care – PPO | Admitting: Family Medicine

## 2013-08-12 VITALS — BP 116/82 | HR 69 | Temp 97.9°F | Ht 61.5 in | Wt 141.6 lb

## 2013-08-12 DIAGNOSIS — E785 Hyperlipidemia, unspecified: Secondary | ICD-10-CM

## 2013-08-12 DIAGNOSIS — Z23 Encounter for immunization: Secondary | ICD-10-CM

## 2013-08-12 DIAGNOSIS — E039 Hypothyroidism, unspecified: Secondary | ICD-10-CM

## 2013-08-12 DIAGNOSIS — Z0000 Encounter for general adult medical examination without abnormal findings: Secondary | ICD-10-CM

## 2013-08-12 DIAGNOSIS — R413 Other amnesia: Secondary | ICD-10-CM

## 2013-08-12 DIAGNOSIS — Z2911 Encounter for prophylactic immunotherapy for respiratory syncytial virus (RSV): Secondary | ICD-10-CM

## 2013-08-12 LAB — HEPATIC FUNCTION PANEL
ALT: 20 U/L (ref 0–35)
AST: 25 U/L (ref 0–37)
Albumin: 4.3 g/dL (ref 3.5–5.2)
Alkaline Phosphatase: 55 U/L (ref 39–117)
BILIRUBIN TOTAL: 0.5 mg/dL (ref 0.2–1.2)
Bilirubin, Direct: 0 mg/dL (ref 0.0–0.3)
Total Protein: 7.1 g/dL (ref 6.0–8.3)

## 2013-08-12 LAB — LIPID PANEL
Cholesterol: 163 mg/dL (ref 0–200)
HDL: 49.7 mg/dL (ref >39.00)
LDL CALC: 86 mg/dL (ref 0–99)
NonHDL: 113.3
TRIGLYCERIDES: 136 mg/dL (ref 0.0–149.0)
Total CHOL/HDL Ratio: 3
VLDL: 27.2 mg/dL (ref 0.0–40.0)

## 2013-08-12 LAB — CBC WITH DIFFERENTIAL/PLATELET
Basophils Absolute: 0 10*3/uL (ref 0.0–0.1)
Basophils Relative: 0.3 % (ref 0.0–3.0)
Eosinophils Absolute: 0.2 10*3/uL (ref 0.0–0.7)
Eosinophils Relative: 2.5 % (ref 0.0–5.0)
HEMATOCRIT: 44.7 % (ref 36.0–46.0)
Hemoglobin: 15 g/dL (ref 12.0–15.0)
LYMPHS ABS: 2.7 10*3/uL (ref 0.7–4.0)
Lymphocytes Relative: 36.6 % (ref 12.0–46.0)
MCHC: 33.6 g/dL (ref 30.0–36.0)
MCV: 95.3 fl (ref 78.0–100.0)
MONO ABS: 0.5 10*3/uL (ref 0.1–1.0)
Monocytes Relative: 6.7 % (ref 3.0–12.0)
Neutro Abs: 4 10*3/uL (ref 1.4–7.7)
Neutrophils Relative %: 53.9 % (ref 43.0–77.0)
Platelets: 245 10*3/uL (ref 150.0–400.0)
RBC: 4.69 Mil/uL (ref 3.87–5.11)
RDW: 13.2 % (ref 11.5–15.5)
WBC: 7.5 10*3/uL (ref 4.0–10.5)

## 2013-08-12 LAB — POCT URINALYSIS DIPSTICK
Bilirubin, UA: NEGATIVE
Blood, UA: NEGATIVE
Glucose, UA: NEGATIVE
Ketones, UA: NEGATIVE
Leukocytes, UA: NEGATIVE
Nitrite, UA: NEGATIVE
PROTEIN UA: NEGATIVE
SPEC GRAV UA: 1.01
Urobilinogen, UA: 0.2
pH, UA: 6.5

## 2013-08-12 LAB — BASIC METABOLIC PANEL
BUN: 18 mg/dL (ref 6–23)
CO2: 28 mEq/L (ref 19–32)
CREATININE: 1 mg/dL (ref 0.4–1.2)
Calcium: 9.5 mg/dL (ref 8.4–10.5)
Chloride: 106 mEq/L (ref 96–112)
GFR: 62.91 mL/min (ref >60.00)
GLUCOSE: 97 mg/dL (ref 70–99)
POTASSIUM: 3.4 meq/L — AB (ref 3.5–5.1)
Sodium: 142 mEq/L (ref 135–145)

## 2013-08-12 LAB — TSH: TSH: 0.41 u[IU]/mL (ref 0.35–4.50)

## 2013-08-12 NOTE — Patient Instructions (Signed)
Preventive Care for Adults A healthy lifestyle and preventive care can promote health and wellness. Preventive health guidelines for women include the following key practices.  A routine yearly physical is a good way to check with your health care provider about your health and preventive screening. It is a chance to share any concerns and updates on your health and to receive a thorough exam.  Visit your dentist for a routine exam and preventive care every 6 months. Brush your teeth twice a day and floss once a day. Good oral hygiene prevents tooth decay and gum disease.  The frequency of eye exams is based on your age, health, family medical history, use of contact lenses, and other factors. Follow your health care provider's recommendations for frequency of eye exams.  Eat a healthy diet. Foods like vegetables, fruits, whole grains, low-fat dairy products, and lean protein foods contain the nutrients you need without too many calories. Decrease your intake of foods high in solid fats, added sugars, and salt. Eat the right amount of calories for you.Get information about a proper diet from your health care provider, if necessary.  Regular physical exercise is one of the most important things you can do for your health. Most adults should get at least 150 minutes of moderate-intensity exercise (any activity that increases your heart rate and causes you to sweat) each week. In addition, most adults need muscle-strengthening exercises on 2 or more days a week.  Maintain a healthy weight. The body mass index (BMI) is a screening tool to identify possible weight problems. It provides an estimate of body fat based on height and weight. Your health care provider can find your BMI and can help you achieve or maintain a healthy weight.For adults 20 years and older:  A BMI below 18.5 is considered underweight.  A BMI of 18.5 to 24.9 is normal.  A BMI of 25 to 29.9 is considered overweight.  A BMI of  30 and above is considered obese.  Maintain normal blood lipids and cholesterol levels by exercising and minimizing your intake of saturated fat. Eat a balanced diet with plenty of fruit and vegetables. Blood tests for lipids and cholesterol should begin at age 76 and be repeated every 5 years. If your lipid or cholesterol levels are high, you are over 50, or you are at high risk for heart disease, you may need your cholesterol levels checked more frequently.Ongoing high lipid and cholesterol levels should be treated with medicines if diet and exercise are not working.  If you smoke, find out from your health care provider how to quit. If you do not use tobacco, do not start.  Lung cancer screening is recommended for adults aged 22-80 years who are at high risk for developing lung cancer because of a history of smoking. A yearly low-dose CT scan of the lungs is recommended for people who have at least a 30-pack-year history of smoking and are a current smoker or have quit within the past 15 years. A pack year of smoking is smoking an average of 1 pack of cigarettes a day for 1 year (for example: 1 pack a day for 30 years or 2 packs a day for 15 years). Yearly screening should continue until the smoker has stopped smoking for at least 15 years. Yearly screening should be stopped for people who develop a health problem that would prevent them from having lung cancer treatment.  If you are pregnant, do not drink alcohol. If you are breastfeeding,  be very cautious about drinking alcohol. If you are not pregnant and choose to drink alcohol, do not have more than 1 drink per day. One drink is considered to be 12 ounces (355 mL) of beer, 5 ounces (148 mL) of wine, or 1.5 ounces (44 mL) of liquor.  Avoid use of street drugs. Do not share needles with anyone. Ask for help if you need support or instructions about stopping the use of drugs.  High blood pressure causes heart disease and increases the risk of  stroke. Your blood pressure should be checked at least every 1 to 2 years. Ongoing high blood pressure should be treated with medicines if weight loss and exercise do not work.  If you are 75-52 years old, ask your health care provider if you should take aspirin to prevent strokes.  Diabetes screening involves taking a blood sample to check your fasting blood sugar level. This should be done once every 3 years, after age 15, if you are within normal weight and without risk factors for diabetes. Testing should be considered at a younger age or be carried out more frequently if you are overweight and have at least 1 risk factor for diabetes.  Breast cancer screening is essential preventive care for women. You should practice "breast self-awareness." This means understanding the normal appearance and feel of your breasts and may include breast self-examination. Any changes detected, no matter how small, should be reported to a health care provider. Women in their 58s and 30s should have a clinical breast exam (CBE) by a health care provider as part of a regular health exam every 1 to 3 years. After age 16, women should have a CBE every year. Starting at age 53, women should consider having a mammogram (breast X-ray test) every year. Women who have a family history of breast cancer should talk to their health care provider about genetic screening. Women at a high risk of breast cancer should talk to their health care providers about having an MRI and a mammogram every year.  Breast cancer gene (BRCA)-related cancer risk assessment is recommended for women who have family members with BRCA-related cancers. BRCA-related cancers include breast, ovarian, tubal, and peritoneal cancers. Having family members with these cancers may be associated with an increased risk for harmful changes (mutations) in the breast cancer genes BRCA1 and BRCA2. Results of the assessment will determine the need for genetic counseling and  BRCA1 and BRCA2 testing.  Routine pelvic exams to screen for cancer are no longer recommended for nonpregnant women who are considered low risk for cancer of the pelvic organs (ovaries, uterus, and vagina) and who do not have symptoms. Ask your health care provider if a screening pelvic exam is right for you.  If you have had past treatment for cervical cancer or a condition that could lead to cancer, you need Pap tests and screening for cancer for at least 20 years after your treatment. If Pap tests have been discontinued, your risk factors (such as having a new sexual partner) need to be reassessed to determine if screening should be resumed. Some women have medical problems that increase the chance of getting cervical cancer. In these cases, your health care provider may recommend more frequent screening and Pap tests.  The HPV test is an additional test that may be used for cervical cancer screening. The HPV test looks for the virus that can cause the cell changes on the cervix. The cells collected during the Pap test can be  tested for HPV. The HPV test could be used to screen women aged 30 years and older, and should be used in women of any age who have unclear Pap test results. After the age of 30, women should have HPV testing at the same frequency as a Pap test.  Colorectal cancer can be detected and often prevented. Most routine colorectal cancer screening begins at the age of 50 years and continues through age 75 years. However, your health care provider may recommend screening at an earlier age if you have risk factors for colon cancer. On a yearly basis, your health care provider may provide home test kits to check for hidden blood in the stool. Use of a small camera at the end of a tube, to directly examine the colon (sigmoidoscopy or colonoscopy), can detect the earliest forms of colorectal cancer. Talk to your health care provider about this at age 50, when routine screening begins. Direct  exam of the colon should be repeated every 5-10 years through age 75 years, unless early forms of pre-cancerous polyps or small growths are found.  People who are at an increased risk for hepatitis B should be screened for this virus. You are considered at high risk for hepatitis B if:  You were born in a country where hepatitis B occurs often. Talk with your health care provider about which countries are considered high risk.  Your parents were born in a high-risk country and you have not received a shot to protect against hepatitis B (hepatitis B vaccine).  You have HIV or AIDS.  You use needles to inject street drugs.  You live with, or have sex with, someone who has hepatitis B.  You get hemodialysis treatment.  You take certain medicines for conditions like cancer, organ transplantation, and autoimmune conditions.  Hepatitis C blood testing is recommended for all people born from 1945 through 1965 and any individual with known risks for hepatitis C.  Practice safe sex. Use condoms and avoid high-risk sexual practices to reduce the spread of sexually transmitted infections (STIs). STIs include gonorrhea, chlamydia, syphilis, trichomonas, herpes, HPV, and human immunodeficiency virus (HIV). Herpes, HIV, and HPV are viral illnesses that have no cure. They can result in disability, cancer, and death.  You should be screened for sexually transmitted illnesses (STIs) including gonorrhea and chlamydia if:  You are sexually active and are younger than 24 years.  You are older than 24 years and your health care provider tells you that you are at risk for this type of infection.  Your sexual activity has changed since you were last screened and you are at an increased risk for chlamydia or gonorrhea. Ask your health care provider if you are at risk.  If you are at risk of being infected with HIV, it is recommended that you take a prescription medicine daily to prevent HIV infection. This is  called preexposure prophylaxis (PrEP). You are considered at risk if:  You are a heterosexual woman, are sexually active, and are at increased risk for HIV infection.  You take drugs by injection.  You are sexually active with a partner who has HIV.  Talk with your health care provider about whether you are at high risk of being infected with HIV. If you choose to begin PrEP, you should first be tested for HIV. You should then be tested every 3 months for as long as you are taking PrEP.  Osteoporosis is a disease in which the bones lose minerals and strength   with aging. This can result in serious bone fractures or breaks. The risk of osteoporosis can be identified using a bone density scan. Women ages 65 years and over and women at risk for fractures or osteoporosis should discuss screening with their health care providers. Ask your health care provider whether you should take a calcium supplement or vitamin D to reduce the rate of osteoporosis.  Menopause can be associated with physical symptoms and risks. Hormone replacement therapy is available to decrease symptoms and risks. You should talk to your health care provider about whether hormone replacement therapy is right for you.  Use sunscreen. Apply sunscreen liberally and repeatedly throughout the day. You should seek shade when your shadow is shorter than you. Protect yourself by wearing long sleeves, pants, a wide-brimmed hat, and sunglasses year round, whenever you are outdoors.  Once a month, do a whole body skin exam, using a mirror to look at the skin on your back. Tell your health care provider of new moles, moles that have irregular borders, moles that are larger than a pencil eraser, or moles that have changed in shape or color.  Stay current with required vaccines (immunizations).  Influenza vaccine. All adults should be immunized every year.  Tetanus, diphtheria, and acellular pertussis (Td, Tdap) vaccine. Pregnant women should  receive 1 dose of Tdap vaccine during each pregnancy. The dose should be obtained regardless of the length of time since the last dose. Immunization is preferred during the 27th-36th week of gestation. An adult who has not previously received Tdap or who does not know her vaccine status should receive 1 dose of Tdap. This initial dose should be followed by tetanus and diphtheria toxoids (Td) booster doses every 10 years. Adults with an unknown or incomplete history of completing a 3-dose immunization series with Td-containing vaccines should begin or complete a primary immunization series including a Tdap dose. Adults should receive a Td booster every 10 years.  Varicella vaccine. An adult without evidence of immunity to varicella should receive 2 doses or a second dose if she has previously received 1 dose. Pregnant females who do not have evidence of immunity should receive the first dose after pregnancy. This first dose should be obtained before leaving the health care facility. The second dose should be obtained 4-8 weeks after the first dose.  Human papillomavirus (HPV) vaccine. Females aged 13-26 years who have not received the vaccine previously should obtain the 3-dose series. The vaccine is not recommended for use in pregnant females. However, pregnancy testing is not needed before receiving a dose. If a female is found to be pregnant after receiving a dose, no treatment is needed. In that case, the remaining doses should be delayed until after the pregnancy. Immunization is recommended for any person with an immunocompromised condition through the age of 26 years if she did not get any or all doses earlier. During the 3-dose series, the second dose should be obtained 4-8 weeks after the first dose. The third dose should be obtained 24 weeks after the first dose and 16 weeks after the second dose.  Zoster vaccine. One dose is recommended for adults aged 60 years or older unless certain conditions are  present.  Measles, mumps, and rubella (MMR) vaccine. Adults born before 1957 generally are considered immune to measles and mumps. Adults born in 1957 or later should have 1 or more doses of MMR vaccine unless there is a contraindication to the vaccine or there is laboratory evidence of immunity to   each of the three diseases. A routine second dose of MMR vaccine should be obtained at least 28 days after the first dose for students attending postsecondary schools, health care workers, or international travelers. People who received inactivated measles vaccine or an unknown type of measles vaccine during 1963-1967 should receive 2 doses of MMR vaccine. People who received inactivated mumps vaccine or an unknown type of mumps vaccine before 1979 and are at high risk for mumps infection should consider immunization with 2 doses of MMR vaccine. For females of childbearing age, rubella immunity should be determined. If there is no evidence of immunity, females who are not pregnant should be vaccinated. If there is no evidence of immunity, females who are pregnant should delay immunization until after pregnancy. Unvaccinated health care workers born before 1957 who lack laboratory evidence of measles, mumps, or rubella immunity or laboratory confirmation of disease should consider measles and mumps immunization with 2 doses of MMR vaccine or rubella immunization with 1 dose of MMR vaccine.  Pneumococcal 13-valent conjugate (PCV13) vaccine. When indicated, a person who is uncertain of her immunization history and has no record of immunization should receive the PCV13 vaccine. An adult aged 19 years or older who has certain medical conditions and has not been previously immunized should receive 1 dose of PCV13 vaccine. This PCV13 should be followed with a dose of pneumococcal polysaccharide (PPSV23) vaccine. The PPSV23 vaccine dose should be obtained at least 8 weeks after the dose of PCV13 vaccine. An adult aged 19  years or older who has certain medical conditions and previously received 1 or more doses of PPSV23 vaccine should receive 1 dose of PCV13. The PCV13 vaccine dose should be obtained 1 or more years after the last PPSV23 vaccine dose.  Pneumococcal polysaccharide (PPSV23) vaccine. When PCV13 is also indicated, PCV13 should be obtained first. All adults aged 65 years and older should be immunized. An adult younger than age 65 years who has certain medical conditions should be immunized. Any person who resides in a nursing home or long-term care facility should be immunized. An adult smoker should be immunized. People with an immunocompromised condition and certain other conditions should receive both PCV13 and PPSV23 vaccines. People with human immunodeficiency virus (HIV) infection should be immunized as soon as possible after diagnosis. Immunization during chemotherapy or radiation therapy should be avoided. Routine use of PPSV23 vaccine is not recommended for American Indians, [place] Natives, or people younger than 65 years unless there are medical conditions that require PPSV23 vaccine. When indicated, people who have unknown immunization and have no record of immunization should receive PPSV23 vaccine. One-time revaccination 5 years after the first dose of PPSV23 is recommended for people aged 19-64 years who have chronic kidney failure, nephrotic syndrome, asplenia, or immunocompromised conditions. People who received 1-2 doses of PPSV23 before age 65 years should receive another dose of PPSV23 vaccine at age 65 years or later if at least 5 years have passed since the previous dose. Doses of PPSV23 are not needed for people immunized with PPSV23 at or after age 65 years.  Meningococcal vaccine. Adults with asplenia or persistent complement component deficiencies should receive 2 doses of quadrivalent meningococcal conjugate (MenACWY-D) vaccine. The doses should be obtained at least 2 months apart.  Microbiologists working with certain meningococcal bacteria, military recruits, people at risk during an outbreak, and people who travel to or live in countries with a high rate of meningitis should be immunized. A first-year college student up through age   21 years who is living in a residence hall should receive a dose if she did not receive a dose on or after her 16th birthday. Adults who have certain high-risk conditions should receive one or more doses of vaccine.  Hepatitis A vaccine. Adults who wish to be protected from this disease, have certain high-risk conditions, work with hepatitis A-infected animals, work in hepatitis A research labs, or travel to or work in countries with a high rate of hepatitis A should be immunized. Adults who were previously unvaccinated and who anticipate close contact with an international adoptee during the first 60 days after arrival in the [place] from a country with a high rate of hepatitis A should be immunized.  Hepatitis B vaccine. Adults who wish to be protected from this disease, have certain high-risk conditions, may be exposed to blood or other infectious body fluids, are household contacts or sex partners of hepatitis B positive people, are clients or workers in certain care facilities, or travel to or work in countries with a high rate of hepatitis B should be immunized.  Haemophilus influenzae type b (Hib) vaccine. A previously unvaccinated person with asplenia or sickle cell disease or having a scheduled splenectomy should receive 1 dose of Hib vaccine. Regardless of previous immunization, a recipient of a hematopoietic stem cell transplant should receive a 3-dose series 6-12 months after her successful transplant. Hib vaccine is not recommended for adults with HIV infection. Preventive Services / Frequency Ages 64 to 68 years  Blood pressure check.** / Every 1 to 2 years.  Lipid and cholesterol check.** / Every 5 years beginning at age  22.  Clinical breast exam.** / Every 3 years for women in their 88s and 53s.  BRCA-related cancer risk assessment.** / For women who have family members with a BRCA-related cancer (breast, ovarian, tubal, or peritoneal cancers).  Pap test.** / Every 2 years from ages 90 through 51. Every 3 years starting at age 21 through age 56 or 3 with a history of 3 consecutive normal Pap tests.  HPV screening.** / Every 3 years from ages 24 through ages 1 to 46 with a history of 3 consecutive normal Pap tests.  Hepatitis C blood test.** / For any individual with known risks for hepatitis C.  Skin self-exam. / Monthly.  Influenza vaccine. / Every year.  Tetanus, diphtheria, and acellular pertussis (Tdap, Td) vaccine.** / Consult your health care provider. Pregnant women should receive 1 dose of Tdap vaccine during each pregnancy. 1 dose of Td every 10 years.  Varicella vaccine.** / Consult your health care provider. Pregnant females who do not have evidence of immunity should receive the first dose after pregnancy.  HPV vaccine. / 3 doses over 6 months, if 72 and younger. The vaccine is not recommended for use in pregnant females. However, pregnancy testing is not needed before receiving a dose.  Measles, mumps, rubella (MMR) vaccine.** / You need at least 1 dose of MMR if you were born in 1957 or later. You may also need a 2nd dose. For females of childbearing age, rubella immunity should be determined. If there is no evidence of immunity, females who are not pregnant should be vaccinated. If there is no evidence of immunity, females who are pregnant should delay immunization until after pregnancy.  Pneumococcal 13-valent conjugate (PCV13) vaccine.** / Consult your health care provider.  Pneumococcal polysaccharide (PPSV23) vaccine.** / 1 to 2 doses if you smoke cigarettes or if you have certain conditions.  Meningococcal vaccine.** /  1 dose if you are age 19 to 21 years and a first-year college  student living in a residence hall, or have one of several medical conditions, you need to get vaccinated against meningococcal disease. You may also need additional booster doses.  Hepatitis A vaccine.** / Consult your health care provider.  Hepatitis B vaccine.** / Consult your health care provider.  Haemophilus influenzae type b (Hib) vaccine.** / Consult your health care provider. Ages 40 to 64 years  Blood pressure check.** / Every 1 to 2 years.  Lipid and cholesterol check.** / Every 5 years beginning at age 20 years.  Lung cancer screening. / Every year if you are aged 55-80 years and have a 30-pack-year history of smoking and currently smoke or have quit within the past 15 years. Yearly screening is stopped once you have quit smoking for at least 15 years or develop a health problem that would prevent you from having lung cancer treatment.  Clinical breast exam.** / Every year after age 40 years.  BRCA-related cancer risk assessment.** / For women who have family members with a BRCA-related cancer (breast, ovarian, tubal, or peritoneal cancers).  Mammogram.** / Every year beginning at age 40 years and continuing for as long as you are in good health. Consult with your health care provider.  Pap test.** / Every 3 years starting at age 30 years through age 65 or 70 years with a history of 3 consecutive normal Pap tests.  HPV screening.** / Every 3 years from ages 30 years through ages 65 to 70 years with a history of 3 consecutive normal Pap tests.  Fecal occult blood test (FOBT) of stool. / Every year beginning at age 50 years and continuing until age 75 years. You may not need to do this test if you get a colonoscopy every 10 years.  Flexible sigmoidoscopy or colonoscopy.** / Every 5 years for a flexible sigmoidoscopy or every 10 years for a colonoscopy beginning at age 50 years and continuing until age 75 years.  Hepatitis C blood test.** / For all people born from 1945 through  1965 and any individual with known risks for hepatitis C.  Skin self-exam. / Monthly.  Influenza vaccine. / Every year.  Tetanus, diphtheria, and acellular pertussis (Tdap/Td) vaccine.** / Consult your health care provider. Pregnant women should receive 1 dose of Tdap vaccine during each pregnancy. 1 dose of Td every 10 years.  Varicella vaccine.** / Consult your health care provider. Pregnant females who do not have evidence of immunity should receive the first dose after pregnancy.  Zoster vaccine.** / 1 dose for adults aged 60 years or older.  Measles, mumps, rubella (MMR) vaccine.** / You need at least 1 dose of MMR if you were born in 1957 or later. You may also need a 2nd dose. For females of childbearing age, rubella immunity should be determined. If there is no evidence of immunity, females who are not pregnant should be vaccinated. If there is no evidence of immunity, females who are pregnant should delay immunization until after pregnancy.  Pneumococcal 13-valent conjugate (PCV13) vaccine.** / Consult your health care provider.  Pneumococcal polysaccharide (PPSV23) vaccine.** / 1 to 2 doses if you smoke cigarettes or if you have certain conditions.  Meningococcal vaccine.** / Consult your health care provider.  Hepatitis A vaccine.** / Consult your health care provider.  Hepatitis B vaccine.** / Consult your health care provider.  Haemophilus influenzae type b (Hib) vaccine.** / Consult your health care provider. Ages 65   years and over  Blood pressure check.** / Every 1 to 2 years.  Lipid and cholesterol check.** / Every 5 years beginning at age 22 years.  Lung cancer screening. / Every year if you are aged 73-80 years and have a 30-pack-year history of smoking and currently smoke or have quit within the past 15 years. Yearly screening is stopped once you have quit smoking for at least 15 years or develop a health problem that would prevent you from having lung cancer  treatment.  Clinical breast exam.** / Every year after age 4 years.  BRCA-related cancer risk assessment.** / For women who have family members with a BRCA-related cancer (breast, ovarian, tubal, or peritoneal cancers).  Mammogram.** / Every year beginning at age 40 years and continuing for as long as you are in good health. Consult with your health care provider.  Pap test.** / Every 3 years starting at age 9 years through age 34 or 91 years with 3 consecutive normal Pap tests. Testing can be stopped between 65 and 70 years with 3 consecutive normal Pap tests and no abnormal Pap or HPV tests in the past 10 years.  HPV screening.** / Every 3 years from ages 57 years through ages 64 or 45 years with a history of 3 consecutive normal Pap tests. Testing can be stopped between 65 and 70 years with 3 consecutive normal Pap tests and no abnormal Pap or HPV tests in the past 10 years.  Fecal occult blood test (FOBT) of stool. / Every year beginning at age 15 years and continuing until age 17 years. You may not need to do this test if you get a colonoscopy every 10 years.  Flexible sigmoidoscopy or colonoscopy.** / Every 5 years for a flexible sigmoidoscopy or every 10 years for a colonoscopy beginning at age 86 years and continuing until age 71 years.  Hepatitis C blood test.** / For all people born from [birth year] through 1967 and any individual with known risks for hepatitis C.  Osteoporosis screening.** / A one-time screening for women ages 83 years and over and women at risk for fractures or osteoporosis.  Skin self-exam. / Monthly.  Influenza vaccine. / Every year.  Tetanus, diphtheria, and acellular pertussis (Tdap/Td) vaccine.** / 1 dose of Td every 10 years.  Varicella vaccine.** / Consult your health care provider.  Zoster vaccine.** / 1 dose for adults aged 61 years or older.  Pneumococcal 13-valent conjugate (PCV13) vaccine.** / Consult your health care provider.  Pneumococcal  polysaccharide (PPSV23) vaccine.** / 1 dose for all adults aged 28 years and older.  Meningococcal vaccine.** / Consult your health care provider.  Hepatitis A vaccine.** / Consult your health care provider.  Hepatitis B vaccine.** / Consult your health care provider.  Haemophilus influenzae type b (Hib) vaccine.** / Consult your health care provider. ** Family history and personal history of risk and conditions may change your health care provider's recommendations. Document Released: 02/27/2001 Document Revised: 05/18/2013 Document Reviewed: 05/29/2010 [facility] Patient Information 2015 [place], [place]. This information is not intended to replace advice given to you by your health care provider. Make sure you discuss any questions you have with your health care provider.

## 2013-08-12 NOTE — Progress Notes (Signed)
Subjective:     Jennifer Small is a 58 y.o. female and is here for a comprehensive physical exam. The patient reports no new problems. Pt has retired from Agricultural consultant.   History   Social History  . Marital Status: Married    Spouse Name: Jennifer Small    Number of Children: 2  . Years of Education: college   Occupational History  .      disability  .  [employer]   Social History Main Topics  . Smoking status: Never Smoker   . Smokeless tobacco: Never Used  . Alcohol Use: 0.5 oz/week    1 drink(s) per week     Comment: rare  . Drug Use: No  . Sexual Activity: Yes    Partners: Male   Other Topics Concern  . Not on file   Social History Narrative   GETS REG EXERCISE--walking   Patient lives at home with her husband Jennifer Small. ).    Patient is on disability    Patient has college education.   Right handed.   Caffeine.- One or two cups daily.            Health Maintenance  Topic Date Due  . Zostavax  02/28/2013  . Influenza Vaccine  08/15/2013  . Colonoscopy  01/15/2014  . Mammogram  03/28/2015  . Pap Smear  06/27/2015  . Tetanus/tdap  07/13/2019    The following portions of the patient's history were reviewed and updated as appropriate:  She  has a past medical history of Thyroid disease; Osteoporosis; Hypothyroidism; Memory loss; and Dementia. She  does not have any pertinent problems on file. She  has past surgical history that includes Cesarean section and Breast lumpectomy. Her family history includes Arthritis in her father; Down syndrome in her brother. She  reports that she has never smoked. She has never used smokeless tobacco. She reports that she drinks about .5 ounces of alcohol per week. She reports that she does not use illicit drugs. She has a current medication list which includes the following prescription(s): aspirin, atorvastatin, divalproex, fenofibrate, fish oil-omega-3 fatty acids, garlic, memantine hcl-donepezil hcl, multivitamin, raloxifene,  and synthroid. Current Outpatient Prescriptions on File Prior to Visit  Medication Sig Dispense Refill  . atorvastatin (LIPITOR) 20 MG tablet TAKE 1 TABLET BY MOUTH EVERY NIGHT AT BEDTIME  90 tablet  0  . divalproex (DEPAKOTE ER) 250 MG 24 hr tablet Take 2 tablets (500 mg total) by mouth at bedtime.  180 tablet  3  . fenofibrate 160 MG tablet TAKE 1 TABLET BY MOUTH EVERY DAY  90 tablet  0  . fish oil-omega-3 fatty acids 1000 MG capsule Take 1 g by mouth daily.        . Garlic (GARLIQUE) 400 MG TBEC Take 1 tablet by mouth daily.        . Memantine HCl-Donepezil HCl (NAMZARIC) 28-10 MG CP24 Take 1 tablet by mouth every morning.  30 capsule  12  . Multiple Vitamin (MULTIVITAMIN) tablet Take 1 tablet by mouth daily.        . raloxifene (EVISTA) 60 MG tablet TAKE 1 TABLET BY MOUTH EVERY DAY  90 tablet  0  . SYNTHROID 112 MCG tablet TAKE 1 TABLET BY MOUTH EVERY MORNING BEFORE BREAKFAST  30 tablet  0   No current facility-administered medications on file prior to visit.   She has No Known Allergies..  Review of Systems Review of Systems  Constitutional: Negative for activity change, appetite change and  fatigue.  HENT: Negative for hearing loss, congestion, tinnitus and ear discharge.  dentist [id number] Eyes: Negative for visual disturbance (see optho-- due) Respiratory: Negative for cough, chest tightness and shortness of breath.   Cardiovascular: Negative for chest pain, palpitations and leg swelling.  Gastrointestinal: Negative for abdominal pain, diarrhea, constipation and abdominal distention.  Genitourinary: Negative for urgency, frequency, decreased urine volume and difficulty urinating.  Musculoskeletal: Negative for back pain, arthralgias and gait problem.  Skin: Negative for color change, pallor and rash.  Neurological: Negative for dizziness, light-headedness, numbness and headaches. ---- memory is status quo per pt Hematological: Negative for adenopathy. Does not bruise/bleed easily.   Psychiatric/Behavioral: Negative for suicidal ideas, confusion, sleep disturbance, self-injury, dysphoric mood, decreased concentration and agitation.       Objective:    BP 116/82  Pulse 69  Temp(Src) 97.9 F (36.6 C) (Oral)  Ht 5' 1.5" (1.562 m)  Wt 141 lb 9.6 oz (64.229 kg)  BMI 26.33 kg/m2  SpO2 97% General appearance: alert, cooperative, appears stated age and no distress Head: Normocephalic, without obvious abnormality, atraumatic Eyes: conjunctivae/corneas clear. PERRL, EOM's intact. Fundi benign. Ears: normal TM's and external ear canals both ears Nose: Nares normal. Septum midline. Mucosa normal. No drainage or sinus tenderness. Throat: lips, mucosa, and tongue normal; teeth and gums normal Neck: no adenopathy, no carotid bruit, no JVD, supple, symmetrical, trachea midline and thyroid not enlarged, symmetric, no tenderness/mass/nodules Back: symmetric, no curvature. ROM normal. No CVA tenderness. Lungs: clear to auscultation bilaterally Breasts: normal appearance, no masses or tenderness Heart: regular rate and rhythm, S1, S2 normal, no murmur, click, rub or gallop Abdomen: soft, non-tender; bowel sounds normal; no masses,  no organomegaly Pelvic: deferred Extremities: extremities normal, atraumatic, no cyanosis or edema Pulses: 2+ and symmetric Skin: Skin color, texture, turgor normal. No rashes or lesions Lymph nodes: Cervical, supraclavicular, and axillary nodes normal. Neurologic: Alert and oriented X 3, normal strength and tone. Normal symmetric reflexes. Normal coordination and gait Psych--no depression, no anxiety--- + memory loss --- no worse      Assessment:    Healthy female exam.     Plan:     check labs ghm utd See After Visit Summary for Counseling Recommendations    1. Unspecified hypothyroidism  - Basic metabolic panel - CBC with Differential - Hepatic function panel - Lipid panel - POCT urinalysis dipstick - TSH  2. Preventative  health care  - Basic metabolic panel - CBC with Differential - Hepatic function panel - Lipid panel - POCT urinalysis dipstick - TSH  3. Other and unspecified hyperlipidemia Check labs  4. Memory loss Per neuro -- con't meds  5. Need for shingles vaccine  - Varicella-zoster vaccine subcutaneous

## 2013-08-12 NOTE — Progress Notes (Signed)
Pre visit review using our clinic review tool, if applicable. No additional management support is needed unless otherwise documented below in the visit note. 

## 2013-08-14 ENCOUNTER — Telehealth: Payer: Self-pay | Admitting: *Deleted

## 2013-08-14 ENCOUNTER — Encounter: Payer: Self-pay | Admitting: *Deleted

## 2013-08-14 DIAGNOSIS — E785 Hyperlipidemia, unspecified: Secondary | ICD-10-CM

## 2013-08-14 NOTE — Telephone Encounter (Signed)
Letter mailed.  Future labs ordered and sent.//AB/CMA  

## 2013-08-14 NOTE — Telephone Encounter (Signed)
Message copied by Verdie ShireBAYNES, Mykai Wendorf M on Fri Aug 14, 2013 12:40 PM ------      Message from: Lelon PerlaLOWNE, YVONNE R      Created: Thu Aug 13, 2013  5:47 PM       Great!  con't meds and recheck 6 months----272.4 lipid, hep ------

## 2013-09-10 ENCOUNTER — Other Ambulatory Visit: Payer: Self-pay | Admitting: Family Medicine

## 2013-09-17 ENCOUNTER — Other Ambulatory Visit: Payer: Self-pay | Admitting: Family Medicine

## 2013-09-29 ENCOUNTER — Other Ambulatory Visit: Payer: Self-pay | Admitting: Family Medicine

## 2013-11-27 ENCOUNTER — Telehealth: Payer: Self-pay | Admitting: *Deleted

## 2013-11-27 DIAGNOSIS — R413 Other amnesia: Secondary | ICD-10-CM

## 2013-11-27 NOTE — Telephone Encounter (Signed)
Called and spoke to patients husband and told him Dr.Yan had put referral in From [facility] some one should be calling soon to get Him scheduled. He understood process.

## 2013-11-27 NOTE — Telephone Encounter (Signed)
Jennifer Small, please let patient know that i have [facility].

## 2013-11-27 NOTE — Telephone Encounter (Signed)
Referred Pt to [facility] for Dementia previously, but patient was not ready to be evaluatedat that time.. She now wants to be referred [facility] if at all [facility]. Pls call.

## 2013-12-09 ENCOUNTER — Encounter: Payer: Self-pay | Admitting: Neurology

## 2014-02-02 IMAGING — RF DG FLUORO GUIDE LUMBAR PUNCTURE
1 series · 1 of 1 positions shown · non-contrast
Comparison: none

CLINICAL DATA: Memory loss

DIAGNOSTIC LUMBAR PUNCTURE UNDER FLUOROSCOPIC GUIDANCE
Fluoroscopy time:  13 seconds minutes.
TECHNIQUE: Informed consent was obtained from the patient prior to
the procedure, including potential complications of headache,
allergy, and pain.   With the patient prone, the lower back was
prepped with Betadine.  1% Lidocaine was used for local anesthesia.
Lumbar puncture was performed at the left L3-4 level using a 20
gauge needle with return of clear CSF with an opening pressure of
13 cm water.   10 ml of CSF were obtained for laboratory studies.
The patient tolerated the procedure well and there were no apparent
complications.

[Series 1: (hospital) · 1 of 1 slices shown]
[im 1/1]
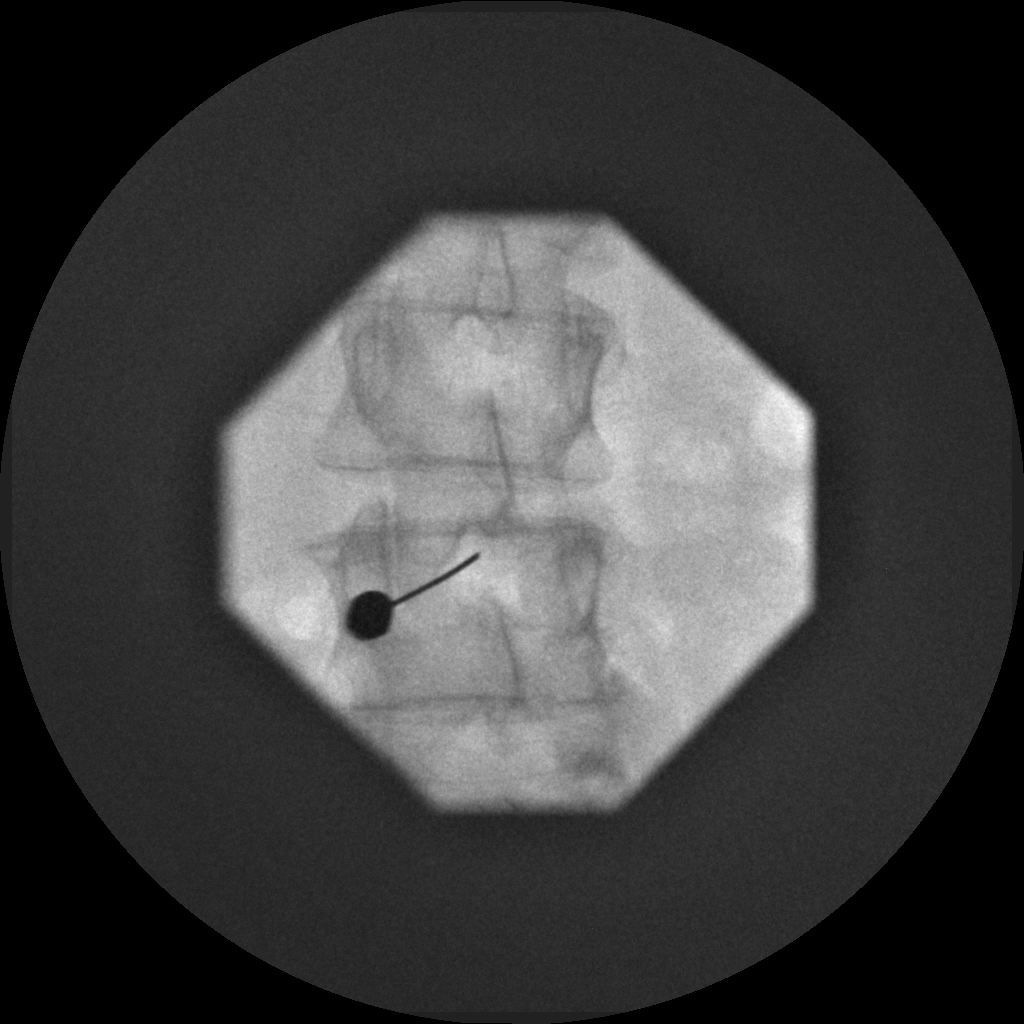

[1 of 1 positions shown; findings below may reference images not displayed]

IMPRESSION: Technically sessile fluoroscopically guided lumbar puncture as
above.

## 2014-03-23 ENCOUNTER — Other Ambulatory Visit: Payer: Self-pay | Admitting: Family Medicine

## 2014-04-02 ENCOUNTER — Encounter: Payer: Self-pay | Admitting: Family Medicine

## 2014-04-02 ENCOUNTER — Ambulatory Visit (INDEPENDENT_AMBULATORY_CARE_PROVIDER_SITE_OTHER): Payer: BC Managed Care – PPO | Admitting: Family Medicine

## 2014-04-02 VITALS — BP 112/80 | HR 73 | Temp 97.9°F | Wt 144.0 lb

## 2014-04-02 DIAGNOSIS — E785 Hyperlipidemia, unspecified: Secondary | ICD-10-CM

## 2014-04-02 MED ORDER — SYNTHROID 112 MCG PO TABS: ORAL_TABLET | ORAL | Status: DC

## 2014-04-02 MED ORDER — RALOXIFENE HCL 60 MG PO TABS: 60.0000 mg | ORAL_TABLET | Freq: Every day | ORAL | Status: DC

## 2014-04-02 MED ORDER — FENOFIBRATE 160 MG PO TABS: 160.0000 mg | ORAL_TABLET | Freq: Every day | ORAL | Status: DC

## 2014-04-02 MED ORDER — ATORVASTATIN CALCIUM 20 MG PO TABS: 20.0000 mg | ORAL_TABLET | Freq: Every day | ORAL | Status: DC

## 2014-04-02 NOTE — Progress Notes (Signed)
Subjective:    Jennifer Small is a 59 y.o. female here for follow up of dyslipidemia. The patient does not use medications that may worsen dyslipidemias (corticosteroids, progestins, anabolic steroids, diuretics, beta-blockers, amiodarone, cyclosporine, olanzapine). The patient exercises infrequently. The patient is not known to have coexisting coronary artery disease.   Cardiac Risk Factors Age > 45-female, > 55-female:  YES  +1  Smoking:   NO  Sig. family hx of CHD*:  NO  Hypertension:   NO  Diabetes:   NO  HDL < 35:   NO  HDL > 59:   NO  Total: 1   *- Sig. family h/o CHD per NCEP = MI or sudden death at <55yo in  father or other 1st-degree female relative, or <65yo in mother or  other 1st-degree female relative  The following portions of the patient's history were reviewed and updated as appropriate:  She  has a past medical history of Thyroid disease; Osteoporosis; Hypothyroidism; Memory loss; and Dementia. She  does not have any pertinent problems on file. She  has past surgical history that includes Cesarean section and Breast lumpectomy. Her family history includes Arthritis in her father; Down syndrome in her brother. She  reports that she has never smoked. She has never used smokeless tobacco. She reports that she drinks about 0.5 oz of alcohol per week. She reports that she does not use illicit drugs. She has a current medication list which includes the following prescription(s): aspirin, atorvastatin, divalproex, fenofibrate, fish oil-omega-3 fatty acids, garlic, memantine hcl-donepezil hcl, multivitamin, raloxifene, and synthroid. Current Outpatient Prescriptions on File Prior to Visit  Medication Sig Dispense Refill  . aspirin 81 MG tablet Take 81 mg by mouth daily.    . divalproex (DEPAKOTE ER) 250 MG 24 hr tablet Take 2 tablets (500 mg total) by mouth at bedtime. 180 tablet 3  . fish oil-omega-3 fatty acids 1000 MG capsule Take 1 g by mouth daily.      . Garlic (GARLIQUE) 400  MG TBEC Take 1 tablet by mouth daily.      . Memantine HCl-Donepezil HCl (NAMZARIC) 28-10 MG CP24 Take 1 tablet by mouth every morning. 30 capsule 12  . Multiple Vitamin (MULTIVITAMIN) tablet Take 1 tablet by mouth daily.       No current facility-administered medications on file prior to visit.   She has No Known Allergies..  Review of Systems Pertinent items are noted in HPI.    Objective:    BP 112/80 mmHg  Pulse 73  Temp(Src) 97.9 F (36.6 C) (Oral)  Wt 144 lb (65.318 kg)  SpO2 97% General appearance: alert, cooperative, appears stated age and no distress Nose: Nares normal. Septum midline. Mucosa normal. No drainage or sinus tenderness. Throat: lips, mucosa, and tongue normal; teeth and gums normal Neck: no adenopathy, no carotid bruit, no JVD, supple, symmetrical, trachea midline and thyroid not enlarged, symmetric, no tenderness/mass/nodules Lungs: clear to auscultation bilaterally Heart: S1, S2 normal Extremities: extremities normal, atraumatic, no cyanosis or edema  Lab Review Lab Results  Component Value Date   CHOL 163 08/12/2013   CHOL 169 06/26/2012   CHOL 193 09/03/2011   HDL 49.70 08/12/2013   HDL 39.50 06/26/2012   HDL 49.60 09/03/2011   LDLDIRECT 108.8 06/26/2012   LDLDIRECT 205.6 06/18/2011   LDLDIRECT 174.8 05/24/2009      Assessment:    Dyslipidemia as detailed above with 6 CHD risk factors using NCEP scheme above.  Target levels for LDL are: < 100 mg/dl (  CHD or "CHD risk equivalent" is present)  Explained to the patient the respective contributions of genetics, diet, and exercise to lipid levels and the use of medication in severe cases which do not respond to lifestyle alteration. The patient's interest and motivation in making lifestyle changes seems good.    Plan:    The following changes are planned for the next 6 months, at which time the patient will return for repeat fasting lipids:  1. Dietary changes: Increase soluble fiber 2.  Exercise changes:  3 days for 60-min  3. Other treatment: None 4. Lipid-lowering medications: lipitor 20 mg  fenofibrate  (Recommended by NCEP after 3-6 mos of dietary therapy & lifestyle modification,  except if CHD is present or LDL well above 190.) 5. Hormone replacement therapy (patient is a postmenopausal  woman): no 6. Screening for secondary causes of dyslipidemias: None indicated 7. Lipid screening for relatives: na 8. Follow up: 6 months.  Note: The majority of the visit was spent in counseling on the pathophysiology and treatment of dyslipidemias. The total face-to-face time was in excess of 20 minutes.    1. Hyperlipidemia - Basic metabolic panel; Future - Hepatic function panel; Future - Lipid panel; Future

## 2014-04-02 NOTE — Patient Instructions (Signed)

## 2014-04-02 NOTE — Progress Notes (Signed)
Pre visit review using our clinic review tool, if applicable. No additional management support is needed unless otherwise documented below in the visit note. 

## 2014-04-06 ENCOUNTER — Other Ambulatory Visit: Payer: BC Managed Care – PPO

## 2014-04-07 ENCOUNTER — Other Ambulatory Visit (INDEPENDENT_AMBULATORY_CARE_PROVIDER_SITE_OTHER): Payer: BC Managed Care – PPO

## 2014-04-07 DIAGNOSIS — E785 Hyperlipidemia, unspecified: Secondary | ICD-10-CM | POA: Diagnosis not present

## 2014-04-07 LAB — BASIC METABOLIC PANEL
BUN: 16 mg/dL (ref 6–23)
CO2: 29 mEq/L (ref 19–32)
Calcium: 9.6 mg/dL (ref 8.4–10.5)
Chloride: 104 mEq/L (ref 96–112)
Creatinine, Ser: 1.08 mg/dL (ref 0.40–1.20)
GFR: 54.8 mL/min — AB (ref >60.00)
GLUCOSE: 118 mg/dL — AB (ref 70–99)
POTASSIUM: 3.7 meq/L (ref 3.5–5.1)
SODIUM: 141 meq/L (ref 135–145)

## 2014-04-07 LAB — HEPATIC FUNCTION PANEL
ALBUMIN: 4.3 g/dL (ref 3.5–5.2)
ALT: 23 U/L (ref 0–35)
AST: 27 U/L (ref 0–37)
Alkaline Phosphatase: 58 U/L (ref 39–117)
Bilirubin, Direct: 0.1 mg/dL (ref 0.0–0.3)
TOTAL PROTEIN: 7 g/dL (ref 6.0–8.3)
Total Bilirubin: 0.5 mg/dL (ref 0.2–1.2)

## 2014-04-07 LAB — LIPID PANEL
CHOL/HDL RATIO: 3
Cholesterol: 181 mg/dL (ref 0–200)
HDL: 56.2 mg/dL (ref >39.00)
LDL Cholesterol: 91 mg/dL (ref 0–99)
NonHDL: 124.8
Triglycerides: 167 mg/dL — ABNORMAL HIGH (ref 0.0–149.0)
VLDL: 33.4 mg/dL (ref 0.0–40.0)

## 2014-07-29 ENCOUNTER — Ambulatory Visit: Payer: BC Managed Care – PPO | Admitting: Nurse Practitioner

## 2014-07-29 ENCOUNTER — Encounter: Payer: Self-pay | Admitting: Nurse Practitioner

## 2014-07-29 ENCOUNTER — Ambulatory Visit (INDEPENDENT_AMBULATORY_CARE_PROVIDER_SITE_OTHER): Payer: BC Managed Care – PPO | Admitting: Nurse Practitioner

## 2014-07-29 VITALS — BP 128/88 | HR 76 | Ht 62.0 in | Wt 146.0 lb

## 2014-07-29 DIAGNOSIS — R413 Other amnesia: Secondary | ICD-10-CM | POA: Diagnosis not present

## 2014-07-29 MED ORDER — MEMANTINE HCL-DONEPEZIL HCL ER 28-10 MG PO CP24: 1.0000 | ORAL_CAPSULE | Freq: Every morning | ORAL | Status: DC

## 2014-07-29 NOTE — Patient Instructions (Signed)
Continue Namzaric 28/10 daily will refill Continue exercising by walking every day [facility], Dr. Zollie PeeJames Burke [phone] appointment date 08/16/2014 Follow-up yearly

## 2014-07-29 NOTE — Progress Notes (Signed)
[facility]  PATIENT: Jennifer Small DOB: 1955-07-25   REASON FOR VISIT: Follow-up for memory disorder, central nervous system degenerative disorder  HISTORY FROM: Husband    HISTORY OF PRESENT ILLNESS:Jennifer Small is a 59 years old right-handed Caucasian female, accompanied by her husband, referred by her primary care physician Dr. Etter Sjogren for evaluation of memory loss since 2012. She has past medical history of hypothyroidism, on supplement, hyperlipidemia, she had college degree, majored in Pharmacologist, she is a Photographer for [employer], Since 2012, her husband noticed she has confusion episode, confused day of the week, do not know how to set up a clock, anxious, losing confident easily, getting worse over the past year, Herself denied any significant difficulty, denied difficulty handling her job, but getting very frustrated easily during office visit. She was diagnosed with hypothyroidism in early 2013, I don't know exact pathology, she was started on thyroid supplement, husband reported mild improvement  There was no significant family history of dementia,  MRI of the brain has demonstrated mild atrophy On follow up visit, she tends to be very agitated, refuse MMSE exam. CSF showed TP 45, Glucose67, VDRL-, ACE0, OCB-, wbc1/rbc 0,  EEG showed frequent spkie-slow burst, increased apperance during photic stimulation. Hyperventilation also induced prolonged long, abnormal generalied slow, spike slow activites. I have started her on Depakote xr, she could not tolerate clonazapam, has excessive drowsy. She was evaluated by Dr Valentina Shaggy in Early 2013, confirmed global and severe cognitive deterioration marked by impairments in multiple cognitive domains, including orientation, attention, mental processing speed, immediate memory, long term memory, language, visual-spatial oragnization, constructional praxis, and abstract reasoning. 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 Labs showed positive ANA, dsDNA, thyroid globulin antibody,elevated TSH 14.7, normal thyroid peroxidase, negative or normal CBC, CMP, HIV, Lyme, VitD, RPR, ESR, CPK, Vit B12, CRP. mildly elevated LDL 116, triglyceride 189, normal CBC, vitamin B12 1474,   UPDATE June 2014: I started her on a trial of prednisone up to 20 mg 2 tablets for one to 2 weeks in April 2014, without significant improvement, she was also evaluated by her rheumatologist Dr. Lenn Sink during that period of time, I do not have the feedback, but per patient, there was no definite connective tissue disease, patient failed to respond to prednisone treatment, it was tapered under the guidance of Dr. Luan Pulling in 2-3 weeks.She denies gait difficulty, on disability now, but confused easily, only drive very short distance,   UPDATE Oct 14th 2014: She is about the same, she is functional at home, she cooks some, she is home with dog, take dog for a walk. She has quit driving, she does not want to go to [place] for second opinion UPDATE July 14th 2015:Her husband retired, they are able to spend more time together, travels some, maintain the house, garden, she sleeps well, eats well, no vision difficulty no gait difficulty, no incontinence. She is taking Namenda, Aricept, Depakote ER 250 mg 2 tablets at night.  UPDATE 07/29/2014: Jennifer Small, 59 year old female returns for follow-up. She has memory loss for several years now. She is currently on Namzaric daily without side effects. Appetite is reportedly good and she sleeps well at night. She and her husband continue to exercise by walking every day. They continue to travel some. No falls no incontinence. Has an appointment the first of next month at [facility] for a second opinion. She returns for reevaluation   REVIEW OF SYSTEMS: Full 14 system review of  systems performed and notable only for those listed, all others are neg:  Constitutional: neg  Cardiovascular:  neg Ear/Nose/Throat: neg  Skin: neg Eyes: neg Respiratory: neg Gastroitestinal: neg  Hematology/Lymphatic: neg  Endocrine: neg Musculoskeletal:neg Allergy/Immunology: neg Neurological: Memory loss Psychiatric: Confusion Sleep : neg   ALLERGIES: No Known Allergies  HOME MEDICATIONS: Outpatient Prescriptions Prior to Visit  Medication Sig Dispense Refill  . aspirin 81 MG tablet Take 81 mg by mouth daily.    Marland Kitchen atorvastatin (LIPITOR) 20 MG tablet Take 1 tablet (20 mg total) by mouth at bedtime. 90 tablet 1  . divalproex (DEPAKOTE ER) 250 MG 24 hr tablet Take 2 tablets (500 mg total) by mouth at bedtime. 180 tablet 3  . fenofibrate 160 MG tablet Take 1 tablet (160 mg total) by mouth daily. 90 tablet 1  . Memantine HCl-Donepezil HCl (NAMZARIC) 28-10 MG CP24 Take 1 tablet by mouth every morning. 30 capsule 12  . Multiple Vitamin (MULTIVITAMIN) tablet Take 1 tablet by mouth daily.      . raloxifene (EVISTA) 60 MG tablet Take 1 tablet (60 mg total) by mouth daily. 90 tablet 1  . SYNTHROID 112 MCG tablet TAKE 1 TABLET BY MOUTH EVERY MORNING BEFORE BREAKFAST 30 tablet 5  . fish oil-omega-3 fatty acids 1000 MG capsule Take 1 g by mouth daily.      . Garlic (GARLIQUE) 748 MG TBEC Take 1 tablet by mouth daily.       No facility-administered medications prior to visit.    PAST MEDICAL HISTORY: Past Medical History  Diagnosis Date  . Thyroid disease     Hypothyroidism  . Osteoporosis   . Hypothyroidism   . Memory loss   . Dementia     PAST SURGICAL HISTORY: Past Surgical History  Procedure Laterality Date  . Cesarean section    . Breast lumpectomy      FAMILY HISTORY: Family History  Problem Relation Age of Onset  . Arthritis Father   . Down syndrome Brother     SOCIAL HISTORY: History   Social History  . Marital Status: Married    Spouse Name: Jennifer Small  . Number of Children: 2  . Years of Education: college   Occupational History  .      disability  .  [place] History Main Topics  . Smoking status: Never Smoker   . Smokeless tobacco: Never Used  . Alcohol Use: 0.5 oz/week    1 drink(s) per week     Comment: rare  . Drug Use: No  . Sexual Activity:    Partners: Male   Other Topics Concern  . Not on file   Social History Narrative   GETS REG EXERCISE--walking   Patient lives at home with her husband Jennifer Small. ).    Patient is on disability    Patient has college education.   Right handed.   Caffeine.- One or two cups daily.              PHYSICAL EXAM  Filed Vitals:   07/29/14 1402  BP: 128/88  Pulse: 76  Height: $Remove'5\' 2"'QdPXLus$  (1.575 m)  Weight: 146 lb (66.225 kg)   Body mass index is 26.7 kg/(m^2). General: Pleasant in no acute distress well groomed Neck: supple no carotid bruits Musculoskeletal no abnormalities  Neurologic Exam  Mental Status: anxious looking female, MMSE 12/30, she is not oriented to time and place, has missed 3/3 recalls, could not spell WORLD backward, she could not  copy figure nor write sentence Cranial Nerves: CN II-XII pupils were equal round reactive to light. Extraocular movements were full. Visual fields were full on confrontational test. Facial sensation and strength were normal. Hearing was intact to finger rubbing bilaterally. Uvula tongue were midline. Head turning and shoulder shrugging were normal and symmetric. Tongue protrusion into the cheeks strength were normal.  Motor: Normal tone, bulk, and strength. Sensory: Normal to light touch, pinprick, proprioception, and vibratory sensation. Coordination: There was no dysmetria noticed. Gait and Station: Narrow based and steady Reflexes: Deep tendon reflexes: normal and symmetric.   DIAGNOSTIC DATA (LABS, IMAGING, TESTING) -    Component Value Date/Time   NA 141 04/07/2014 0852   K 3.7 04/07/2014 0852   CL 104 04/07/2014 0852   CO2 29 04/07/2014 0852   GLUCOSE 118* 04/07/2014 0852   BUN 16 04/07/2014 0852    CREATININE 1.08 04/07/2014 0852   CALCIUM 9.6 04/07/2014 0852   PROT 7.0 04/07/2014 0852   ALBUMIN 4.3 04/07/2014 0852   AST 27 04/07/2014 0852   ALT 23 04/07/2014 0852   ALKPHOS 58 04/07/2014 0852   BILITOT 0.5 04/07/2014 0852   GFRNONAA 86.21 05/24/2009 1003   Lab Results  Component Value Date   CHOL 181 04/07/2014   HDL 56.20 04/07/2014   LDLCALC 91 04/07/2014   LDLDIRECT 108.8 06/26/2012   TRIG 167.0* 04/07/2014   CHOLHDL 3 04/07/2014    ASSESSMENT AND PLAN  59 y.o. year old female  has a past medical history of Thyroid disease; short-term memory loss which has been progressive since 2012, elevated inflammatory markers including positive ANA and double-stranded DNA, thyroglobulin antibody, abnormal EEG with trial of prednisone without benefit. Her diagnosis is most consistent with central nervous system degenerative disorder.  Continue Namzaric 28/10 daily will refill Continue exercising by walking every day Appointment at [facility], Dr. Wallene Dales [phone] appointment date 08/16/2014 Follow-up yearly Dennie Bible, [facility], [facility], [facility] [address] [place], [place] [address] [phone]

## 2014-08-02 ENCOUNTER — Telehealth: Payer: Self-pay | Admitting: Family Medicine

## 2014-08-02 NOTE — Telephone Encounter (Signed)
pre visit letter mailed 07/29/14 °

## 2014-08-09 NOTE — Progress Notes (Signed)
I have reviewed and agreed above plan. 

## 2014-08-16 DIAGNOSIS — G301 Alzheimer's disease with late onset: Secondary | ICD-10-CM

## 2014-08-16 DIAGNOSIS — F0280 Dementia in other diseases classified elsewhere without behavioral disturbance: Secondary | ICD-10-CM | POA: Insufficient documentation

## 2014-08-18 ENCOUNTER — Telehealth: Payer: Self-pay | Admitting: Behavioral Health

## 2014-08-18 NOTE — Telephone Encounter (Signed)
Unable to reach patient at time of Pre-Visit Call.  Left message for patient to return call when available.    

## 2014-08-19 ENCOUNTER — Encounter: Payer: Self-pay | Admitting: Family Medicine

## 2014-08-19 ENCOUNTER — Ambulatory Visit (INDEPENDENT_AMBULATORY_CARE_PROVIDER_SITE_OTHER): Payer: BC Managed Care – PPO | Admitting: Family Medicine

## 2014-08-19 VITALS — BP 124/82 | HR 59 | Temp 98.1°F | Ht 61.75 in | Wt 144.6 lb

## 2014-08-19 DIAGNOSIS — R413 Other amnesia: Secondary | ICD-10-CM

## 2014-08-19 DIAGNOSIS — E038 Other specified hypothyroidism: Secondary | ICD-10-CM

## 2014-08-19 DIAGNOSIS — R319 Hematuria, unspecified: Secondary | ICD-10-CM | POA: Diagnosis not present

## 2014-08-19 DIAGNOSIS — Z23 Encounter for immunization: Secondary | ICD-10-CM | POA: Diagnosis not present

## 2014-08-19 DIAGNOSIS — Z1239 Encounter for other screening for malignant neoplasm of breast: Secondary | ICD-10-CM | POA: Diagnosis not present

## 2014-08-19 DIAGNOSIS — E785 Hyperlipidemia, unspecified: Secondary | ICD-10-CM | POA: Diagnosis not present

## 2014-08-19 DIAGNOSIS — E781 Pure hyperglyceridemia: Secondary | ICD-10-CM | POA: Diagnosis not present

## 2014-08-19 DIAGNOSIS — R739 Hyperglycemia, unspecified: Secondary | ICD-10-CM | POA: Diagnosis not present

## 2014-08-19 DIAGNOSIS — M810 Age-related osteoporosis without current pathological fracture: Secondary | ICD-10-CM | POA: Diagnosis not present

## 2014-08-19 DIAGNOSIS — Z0000 Encounter for general adult medical examination without abnormal findings: Secondary | ICD-10-CM

## 2014-08-19 LAB — TSH: TSH: 1.45 u[IU]/mL (ref 0.35–4.50)

## 2014-08-19 LAB — CBC WITH DIFFERENTIAL/PLATELET
BASOS ABS: 0.1 10*3/uL (ref 0.0–0.1)
Basophils Relative: 0.6 % (ref 0.0–3.0)
EOS PCT: 1.4 % (ref 0.0–5.0)
Eosinophils Absolute: 0.1 10*3/uL (ref 0.0–0.7)
HEMATOCRIT: 44.3 % (ref 36.0–46.0)
Hemoglobin: 15 g/dL (ref 12.0–15.0)
LYMPHS PCT: 29.2 % (ref 12.0–46.0)
Lymphs Abs: 2.3 10*3/uL (ref 0.7–4.0)
MCHC: 33.9 g/dL (ref 30.0–36.0)
MCV: 93 fl (ref 78.0–100.0)
Monocytes Absolute: 0.5 10*3/uL (ref 0.1–1.0)
Monocytes Relative: 6.6 % (ref 3.0–12.0)
NEUTROS ABS: 4.9 10*3/uL (ref 1.4–7.7)
Neutrophils Relative %: 62.2 % (ref 43.0–77.0)
Platelets: 280 10*3/uL (ref 150.0–400.0)
RBC: 4.77 Mil/uL (ref 3.87–5.11)
RDW: 12.9 % (ref 11.5–15.5)
WBC: 7.9 10*3/uL (ref 4.0–10.5)

## 2014-08-19 LAB — HEPATIC FUNCTION PANEL
ALK PHOS: 51 U/L (ref 39–117)
ALT: 25 U/L (ref 0–35)
AST: 30 U/L (ref 0–37)
Albumin: 4.2 g/dL (ref 3.5–5.2)
Bilirubin, Direct: 0.1 mg/dL (ref 0.0–0.3)
Total Bilirubin: 0.5 mg/dL (ref 0.2–1.2)
Total Protein: 6.9 g/dL (ref 6.0–8.3)

## 2014-08-19 LAB — POCT URINALYSIS DIPSTICK
Bilirubin, UA: NEGATIVE
Glucose, UA: NEGATIVE
KETONES UA: NEGATIVE
LEUKOCYTES UA: NEGATIVE
Nitrite, UA: NEGATIVE
PROTEIN UA: NEGATIVE
Spec Grav, UA: 1.02
Urobilinogen, UA: 2
pH, UA: 6.5

## 2014-08-19 LAB — HEMOGLOBIN A1C: Hgb A1c MFr Bld: 5.6 % (ref 4.6–6.5)

## 2014-08-19 LAB — LIPID PANEL
CHOL/HDL RATIO: 3
Cholesterol: 167 mg/dL (ref 0–200)
HDL: 55.3 mg/dL (ref >39.00)
LDL Cholesterol: 89 mg/dL (ref 0–99)
NonHDL: 111.68
Triglycerides: 111 mg/dL (ref 0.0–149.0)
VLDL: 22.2 mg/dL (ref 0.0–40.0)

## 2014-08-19 LAB — BASIC METABOLIC PANEL
BUN: 14 mg/dL (ref 6–23)
CALCIUM: 9.5 mg/dL (ref 8.4–10.5)
CO2: 30 mEq/L (ref 19–32)
Chloride: 106 mEq/L (ref 96–112)
Creatinine, Ser: 0.9 mg/dL (ref 0.40–1.20)
GFR: 67.55 mL/min (ref >60.00)
GLUCOSE: 106 mg/dL — AB (ref 70–99)
Potassium: 3.9 mEq/L (ref 3.5–5.1)
SODIUM: 143 meq/L (ref 135–145)

## 2014-08-19 MED ORDER — RALOXIFENE HCL 60 MG PO TABS: 60.0000 mg | ORAL_TABLET | Freq: Every day | ORAL | Status: DC

## 2014-08-19 MED ORDER — FENOFIBRATE 160 MG PO TABS: 160.0000 mg | ORAL_TABLET | Freq: Every day | ORAL | Status: DC

## 2014-08-19 MED ORDER — SYNTHROID 112 MCG PO TABS: ORAL_TABLET | ORAL | Status: DC

## 2014-08-19 MED ORDER — ATORVASTATIN CALCIUM 20 MG PO TABS: 20.0000 mg | ORAL_TABLET | Freq: Every day | ORAL | Status: DC

## 2014-08-19 NOTE — Assessment & Plan Note (Signed)
Now on namzaric from neuro con't per neuro

## 2014-08-19 NOTE — Progress Notes (Signed)
Subjective:     Jennifer Small is a 59 y.o. female and is here for a comprehensive physical exam. The patient reports no new problems.  Her husband is present.  They are seeing [facility] neuro for her memory issues.  Husband helps with history.   History   Social History  . Marital Status: Married    Spouse Name: [name]  . Number of Children: 2  . Years of Education: college   Occupational History  .      disability  .  [employer]   Social History Main Topics  . Smoking status: Never Smoker   . Smokeless tobacco: Never Used  . Alcohol Use: 0.5 oz/week    1 drink(s) per week     Comment: rare  . Drug Use: No  . Sexual Activity:    Partners: Male   Other Topics Concern  . Not on file   Social History Narrative   GETS REG EXERCISE--walking   Patient lives at home with her husband [name]. ).    Patient is on disability    Patient has college education.   Right handed.   Caffeine.- One or two cups daily.            Health Maintenance  Topic Date Due  . COLONOSCOPY  01/15/2014  . INFLUENZA VACCINE  04/02/2015 (Originally 08/16/2014)  . HIV Screening  04/02/2015 (Originally 02/29/1970)  . MAMMOGRAM  03/28/2015  . PAP SMEAR  06/27/2015  . TETANUS/TDAP  08/18/2024  . ZOSTAVAX  Completed    The following portions of the patient's history were reviewed and updated as appropriate:  She  has a past medical history of Thyroid disease; Osteoporosis; Hypothyroidism; Memory loss; and Dementia. She  does not have any pertinent problems on file. She  has past surgical history that includes Cesarean section and Breast lumpectomy. Her family history includes Arthritis in her father; Down syndrome in her brother. She  reports that she has never smoked. She has never used smokeless tobacco. She reports that she drinks about 0.5 oz of alcohol per week. She reports that she does not use illicit drugs. She has a current medication list which includes the following prescription(s):  aspirin, atorvastatin, fenofibrate, memantine hcl-donepezil hcl, multivitamin, raloxifene, and synthroid. Current Outpatient Prescriptions on File Prior to Visit  Medication Sig Dispense Refill  . aspirin 81 MG tablet Take 81 mg by mouth daily.    . Memantine HCl-Donepezil HCl (NAMZARIC) 28-10 MG CP24 Take 1 tablet by mouth every morning. 30 capsule 12  . Multiple Vitamin (MULTIVITAMIN) tablet Take 1 tablet by mouth daily.       No current facility-administered medications on file prior to visit.   She has No Known Allergies..  Review of Systems Review of Systems  Constitutional: Negative for activity change, appetite change and fatigue.  HENT: Negative for hearing loss, congestion, tinnitus and ear discharge.  dentist q46m Eyes: Negative for visual disturbance (see optho-- due) Respiratory: Negative for cough, chest tightness and shortness of breath.   Cardiovascular: Negative for chest pain, palpitations and leg swelling.  Gastrointestinal: Negative for abdominal pain, diarrhea, constipation and abdominal distention.  Genitourinary: Negative for urgency, frequency, decreased urine volume and difficulty urinating.  Musculoskeletal: Negative for back pain, arthralgias and gait problem.  Skin: Negative for color change, pallor and rash.  Neurological: Negative for dizziness, light-headedness, numbness and headaches.  Hematological: Negative for adenopathy. Does not bruise/bleed easily.  Psychiatric/Behavioral: Negative for suicidal ideas, , sleep disturbance, self-injury, dysphoric mood,  Flat affect-- memory worsening slowly-- sees neuro at [facility]      Objective:    BP 124/82 mmHg  Pulse 59  Temp(Src) 98.1 F (36.7 C) (Oral)  Ht 5' 1.75" (1.568 m)  Wt 144 lb 9.6 oz (65.59 kg)  BMI 26.68 kg/m2  SpO2 98% General appearance: alert, cooperative, appears stated age and no distress Head: Normocephalic, without obvious abnormality, atraumatic Eyes: conjunctivae/corneas clear. PERRL,  EOM's intact. Fundi benign. Ears: normal TM's and external ear canals both ears Nose: Nares normal. Septum midline. Mucosa normal. No drainage or sinus tenderness. Throat: lips, mucosa, and tongue normal; teeth and gums normal Neck: no adenopathy, no carotid bruit, no JVD, supple, symmetrical, trachea midline and thyroid not enlarged, symmetric, no tenderness/mass/nodules Back: symmetric, no curvature. ROM normal. No CVA tenderness. Lungs: clear to auscultation bilaterally Breasts: normal appearance, no masses or tenderness Heart: regular rate and rhythm, S1, S2 normal, no murmur, click, rub or gallop Abdomen: soft, non-tender; bowel sounds normal; no masses,  no organomegaly Pelvic: not indicated; post-menopausal, no abnormal Pap smears in past Extremities: extremities normal, atraumatic, no cyanosis or edema Pulses: 2+ and symmetric Skin: Skin color, texture, turgor normal. No rashes or lesions Lymph nodes: Cervical, supraclavicular, and axillary nodes normal. Neurologic: Alert and oriented X 3, normal strength and tone. Normal symmetric reflexes. Normal coordination and gait Psych-  + memory loss-- slowly worsening      Assessment:    Healthy female exam.-     Plan:    ghm utd Check labs See After Visit Summary for Counseling Recommendations    1. High triglycerides Check labs today - fenofibrate 160 MG tablet; Take 1 tablet (160 mg total) by mouth daily.  Dispense: 90 tablet; Refill: 1 - POCT urinalysis dipstick  2. Other specified hypothyroidism Check labs  - SYNTHROID 112 MCG tablet; TAKE 1 TABLET BY MOUTH EVERY MORNING BEFORE BREAKFAST  Dispense: 90 tablet; Refill: 1 - POCT urinalysis dipstick - TSH  3. Hyperlipidemia Check labs - atorvastatin (LIPITOR) 20 MG tablet; Take 1 tablet (20 mg total) by mouth at bedtime.  Dispense: 90 tablet; Refill: 1 - CBC with Differential/Platelet - Hepatic function panel - Lipid panel - POCT urinalysis dipstick  4.  Hyperglycemia Watch simple sugars and starches - Basic metabolic panel - CBC with Differential/Platelet - Hemoglobin A1c - POCT urinalysis dipstick  5. Osteoporosis con't meds bmd next year - raloxifene (EVISTA) 60 MG tablet; Take 1 tablet (60 mg total) by mouth daily.  Dispense: 90 tablet; Refill: 1  6. Breast cancer screening   - MM DIGITAL SCREENING BILATERAL; Future  7. Preventative health care See AVS ghm utd See above - Ambulatory referral to Gastroenterology

## 2014-08-19 NOTE — Progress Notes (Signed)
Pre visit review using our clinic review tool, if applicable. No additional management support is needed unless otherwise documented below in the visit note. 

## 2014-08-19 NOTE — Patient Instructions (Signed)
Preventive Care for Adults A healthy lifestyle and preventive care can promote health and wellness. Preventive health guidelines for women include the following key practices.  A routine yearly physical is a good way to check with your health care provider about your health and preventive screening. It is a chance to share any concerns and updates on your health and to receive a thorough exam.  Visit your dentist for a routine exam and preventive care every 6 months. Brush your teeth twice a day and floss once a day. Good oral hygiene prevents tooth decay and gum disease.  The frequency of eye exams is based on your age, health, family medical history, use of contact lenses, and other factors. Follow your health care provider's recommendations for frequency of eye exams.  Eat a healthy diet. Foods like vegetables, fruits, whole grains, low-fat dairy products, and lean protein foods contain the nutrients you need without too many calories. Decrease your intake of foods high in solid fats, added sugars, and salt. Eat the right amount of calories for you.Get information about a proper diet from your health care provider, if necessary.  Regular physical exercise is one of the most important things you can do for your health. Most adults should get at least 150 minutes of moderate-intensity exercise (any activity that increases your heart rate and causes you to sweat) each week. In addition, most adults need muscle-strengthening exercises on 2 or more days a week.  Maintain a healthy weight. The body mass index (BMI) is a screening tool to identify possible weight problems. It provides an estimate of body fat based on height and weight. Your health care provider can find your BMI and can help you achieve or maintain a healthy weight.For adults 20 years and older:  A BMI below 18.5 is considered underweight.  A BMI of 18.5 to 24.9 is normal.  A BMI of 25 to 29.9 is considered overweight.  A BMI of  30 and above is considered obese.  Maintain normal blood lipids and cholesterol levels by exercising and minimizing your intake of saturated fat. Eat a balanced diet with plenty of fruit and vegetables. Blood tests for lipids and cholesterol should begin at age 76 and be repeated every 5 years. If your lipid or cholesterol levels are high, you are over 50, or you are at high risk for heart disease, you may need your cholesterol levels checked more frequently.Ongoing high lipid and cholesterol levels should be treated with medicines if diet and exercise are not working.  If you smoke, find out from your health care provider how to quit. If you do not use tobacco, do not start.  Lung cancer screening is recommended for adults aged 22-80 years who are at high risk for developing lung cancer because of a history of smoking. A yearly low-dose CT scan of the lungs is recommended for people who have at least a 30-pack-year history of smoking and are a current smoker or have quit within the past 15 years. A pack year of smoking is smoking an average of 1 pack of cigarettes a day for 1 year (for example: 1 pack a day for 30 years or 2 packs a day for 15 years). Yearly screening should continue until the smoker has stopped smoking for at least 15 years. Yearly screening should be stopped for people who develop a health problem that would prevent them from having lung cancer treatment.  If you are pregnant, do not drink alcohol. If you are breastfeeding,  be very cautious about drinking alcohol. If you are not pregnant and choose to drink alcohol, do not have more than 1 drink per day. One drink is considered to be 12 ounces (355 mL) of beer, 5 ounces (148 mL) of wine, or 1.5 ounces (44 mL) of liquor.  Avoid use of street drugs. Do not share needles with anyone. Ask for help if you need support or instructions about stopping the use of drugs.  High blood pressure causes heart disease and increases the risk of  stroke. Your blood pressure should be checked at least every 1 to 2 years. Ongoing high blood pressure should be treated with medicines if weight loss and exercise do not work.  If you are 75-52 years old, ask your health care provider if you should take aspirin to prevent strokes.  Diabetes screening involves taking a blood sample to check your fasting blood sugar level. This should be done once every 3 years, after age 15, if you are within normal weight and without risk factors for diabetes. Testing should be considered at a younger age or be carried out more frequently if you are overweight and have at least 1 risk factor for diabetes.  Breast cancer screening is essential preventive care for women. You should practice "breast self-awareness." This means understanding the normal appearance and feel of your breasts and may include breast self-examination. Any changes detected, no matter how small, should be reported to a health care provider. Women in their 58s and 30s should have a clinical breast exam (CBE) by a health care provider as part of a regular health exam every 1 to 3 years. After age 16, women should have a CBE every year. Starting at age 53, women should consider having a mammogram (breast X-ray test) every year. Women who have a family history of breast cancer should talk to their health care provider about genetic screening. Women at a high risk of breast cancer should talk to their health care providers about having an MRI and a mammogram every year.  Breast cancer gene (BRCA)-related cancer risk assessment is recommended for women who have family members with BRCA-related cancers. BRCA-related cancers include breast, ovarian, tubal, and peritoneal cancers. Having family members with these cancers may be associated with an increased risk for harmful changes (mutations) in the breast cancer genes BRCA1 and BRCA2. Results of the assessment will determine the need for genetic counseling and  BRCA1 and BRCA2 testing.  Routine pelvic exams to screen for cancer are no longer recommended for nonpregnant women who are considered low risk for cancer of the pelvic organs (ovaries, uterus, and vagina) and who do not have symptoms. Ask your health care provider if a screening pelvic exam is right for you.  If you have had past treatment for cervical cancer or a condition that could lead to cancer, you need Pap tests and screening for cancer for at least 20 years after your treatment. If Pap tests have been discontinued, your risk factors (such as having a new sexual partner) need to be reassessed to determine if screening should be resumed. Some women have medical problems that increase the chance of getting cervical cancer. In these cases, your health care provider may recommend more frequent screening and Pap tests.  The HPV test is an additional test that may be used for cervical cancer screening. The HPV test looks for the virus that can cause the cell changes on the cervix. The cells collected during the Pap test can be  tested for HPV. The HPV test could be used to screen women aged 30 years and older, and should be used in women of any age who have unclear Pap test results. After the age of 30, women should have HPV testing at the same frequency as a Pap test.  Colorectal cancer can be detected and often prevented. Most routine colorectal cancer screening begins at the age of 50 years and continues through age 75 years. However, your health care provider may recommend screening at an earlier age if you have risk factors for colon cancer. On a yearly basis, your health care provider may provide home test kits to check for hidden blood in the stool. Use of a small camera at the end of a tube, to directly examine the colon (sigmoidoscopy or colonoscopy), can detect the earliest forms of colorectal cancer. Talk to your health care provider about this at age 50, when routine screening begins. Direct  exam of the colon should be repeated every 5-10 years through age 75 years, unless early forms of pre-cancerous polyps or small growths are found.  People who are at an increased risk for hepatitis B should be screened for this virus. You are considered at high risk for hepatitis B if:  You were born in a country where hepatitis B occurs often. Talk with your health care provider about which countries are considered high risk.  Your parents were born in a high-risk country and you have not received a shot to protect against hepatitis B (hepatitis B vaccine).  You have HIV or AIDS.  You use needles to inject street drugs.  You live with, or have sex with, someone who has hepatitis B.  You get hemodialysis treatment.  You take certain medicines for conditions like cancer, organ transplantation, and autoimmune conditions.  Hepatitis C blood testing is recommended for all people born from 1945 through 1965 and any individual with known risks for hepatitis C.  Practice safe sex. Use condoms and avoid high-risk sexual practices to reduce the spread of sexually transmitted infections (STIs). STIs include gonorrhea, chlamydia, syphilis, trichomonas, herpes, HPV, and human immunodeficiency virus (HIV). Herpes, HIV, and HPV are viral illnesses that have no cure. They can result in disability, cancer, and death.  You should be screened for sexually transmitted illnesses (STIs) including gonorrhea and chlamydia if:  You are sexually active and are younger than 24 years.  You are older than 24 years and your health care provider tells you that you are at risk for this type of infection.  Your sexual activity has changed since you were last screened and you are at an increased risk for chlamydia or gonorrhea. Ask your health care provider if you are at risk.  If you are at risk of being infected with HIV, it is recommended that you take a prescription medicine daily to prevent HIV infection. This is  called preexposure prophylaxis (PrEP). You are considered at risk if:  You are a heterosexual woman, are sexually active, and are at increased risk for HIV infection.  You take drugs by injection.  You are sexually active with a partner who has HIV.  Talk with your health care provider about whether you are at high risk of being infected with HIV. If you choose to begin PrEP, you should first be tested for HIV. You should then be tested every 3 months for as long as you are taking PrEP.  Osteoporosis is a disease in which the bones lose minerals and strength   with aging. This can result in serious bone fractures or breaks. The risk of osteoporosis can be identified using a bone density scan. Women ages 65 years and over and women at risk for fractures or osteoporosis should discuss screening with their health care providers. Ask your health care provider whether you should take a calcium supplement or vitamin D to reduce the rate of osteoporosis.  Menopause can be associated with physical symptoms and risks. Hormone replacement therapy is available to decrease symptoms and risks. You should talk to your health care provider about whether hormone replacement therapy is right for you.  Use sunscreen. Apply sunscreen liberally and repeatedly throughout the day. You should seek shade when your shadow is shorter than you. Protect yourself by wearing long sleeves, pants, a wide-brimmed hat, and sunglasses year round, whenever you are outdoors.  Once a month, do a whole body skin exam, using a mirror to look at the skin on your back. Tell your health care provider of new moles, moles that have irregular borders, moles that are larger than a pencil eraser, or moles that have changed in shape or color.  Stay current with required vaccines (immunizations).  Influenza vaccine. All adults should be immunized every year.  Tetanus, diphtheria, and acellular pertussis (Td, Tdap) vaccine. Pregnant women should  receive 1 dose of Tdap vaccine during each pregnancy. The dose should be obtained regardless of the length of time since the last dose. Immunization is preferred during the 27th-36th week of gestation. An adult who has not previously received Tdap or who does not know her vaccine status should receive 1 dose of Tdap. This initial dose should be followed by tetanus and diphtheria toxoids (Td) booster doses every 10 years. Adults with an unknown or incomplete history of completing a 3-dose immunization series with Td-containing vaccines should begin or complete a primary immunization series including a Tdap dose. Adults should receive a Td booster every 10 years.  Varicella vaccine. An adult without evidence of immunity to varicella should receive 2 doses or a second dose if she has previously received 1 dose. Pregnant females who do not have evidence of immunity should receive the first dose after pregnancy. This first dose should be obtained before leaving the health care facility. The second dose should be obtained 4-8 weeks after the first dose.  Human papillomavirus (HPV) vaccine. Females aged 13-26 years who have not received the vaccine previously should obtain the 3-dose series. The vaccine is not recommended for use in pregnant females. However, pregnancy testing is not needed before receiving a dose. If a female is found to be pregnant after receiving a dose, no treatment is needed. In that case, the remaining doses should be delayed until after the pregnancy. Immunization is recommended for any person with an immunocompromised condition through the age of 26 years if she did not get any or all doses earlier. During the 3-dose series, the second dose should be obtained 4-8 weeks after the first dose. The third dose should be obtained 24 weeks after the first dose and 16 weeks after the second dose.  Zoster vaccine. One dose is recommended for adults aged 60 years or older unless certain conditions are  present.  Measles, mumps, and rubella (MMR) vaccine. Adults born before 1957 generally are considered immune to measles and mumps. Adults born in 1957 or later should have 1 or more doses of MMR vaccine unless there is a contraindication to the vaccine or there is laboratory evidence of immunity to   each of the three diseases. A routine second dose of MMR vaccine should be obtained at least 28 days after the first dose for students attending postsecondary schools, health care workers, or international travelers. People who received inactivated measles vaccine or an unknown type of measles vaccine during 1963-1967 should receive 2 doses of MMR vaccine. People who received inactivated mumps vaccine or an unknown type of mumps vaccine before 1979 and are at high risk for mumps infection should consider immunization with 2 doses of MMR vaccine. For females of childbearing age, rubella immunity should be determined. If there is no evidence of immunity, females who are not pregnant should be vaccinated. If there is no evidence of immunity, females who are pregnant should delay immunization until after pregnancy. Unvaccinated health care workers born before 1957 who lack laboratory evidence of measles, mumps, or rubella immunity or laboratory confirmation of disease should consider measles and mumps immunization with 2 doses of MMR vaccine or rubella immunization with 1 dose of MMR vaccine.  Pneumococcal 13-valent conjugate (PCV13) vaccine. When indicated, a person who is uncertain of her immunization history and has no record of immunization should receive the PCV13 vaccine. An adult aged 19 years or older who has certain medical conditions and has not been previously immunized should receive 1 dose of PCV13 vaccine. This PCV13 should be followed with a dose of pneumococcal polysaccharide (PPSV23) vaccine. The PPSV23 vaccine dose should be obtained at least 8 weeks after the dose of PCV13 vaccine. An adult aged 19  years or older who has certain medical conditions and previously received 1 or more doses of PPSV23 vaccine should receive 1 dose of PCV13. The PCV13 vaccine dose should be obtained 1 or more years after the last PPSV23 vaccine dose.  Pneumococcal polysaccharide (PPSV23) vaccine. When PCV13 is also indicated, PCV13 should be obtained first. All adults aged 65 years and older should be immunized. An adult younger than age 65 years who has certain medical conditions should be immunized. Any person who resides in a nursing home or long-term care facility should be immunized. An adult smoker should be immunized. People with an immunocompromised condition and certain other conditions should receive both PCV13 and PPSV23 vaccines. People with human immunodeficiency virus (HIV) infection should be immunized as soon as possible after diagnosis. Immunization during chemotherapy or radiation therapy should be avoided. Routine use of PPSV23 vaccine is not recommended for American Indians, [place] Natives, or people younger than 65 years unless there are medical conditions that require PPSV23 vaccine. When indicated, people who have unknown immunization and have no record of immunization should receive PPSV23 vaccine. One-time revaccination 5 years after the first dose of PPSV23 is recommended for people aged 19-64 years who have chronic kidney failure, nephrotic syndrome, asplenia, or immunocompromised conditions. People who received 1-2 doses of PPSV23 before age 65 years should receive another dose of PPSV23 vaccine at age 65 years or later if at least 5 years have passed since the previous dose. Doses of PPSV23 are not needed for people immunized with PPSV23 at or after age 65 years.  Meningococcal vaccine. Adults with asplenia or persistent complement component deficiencies should receive 2 doses of quadrivalent meningococcal conjugate (MenACWY-D) vaccine. The doses should be obtained at least 2 months apart.  Microbiologists working with certain meningococcal bacteria, military recruits, people at risk during an outbreak, and people who travel to or live in countries with a high rate of meningitis should be immunized. A first-year college student up through age   21 years who is living in a residence hall should receive a dose if she did not receive a dose on or after her 16th birthday. Adults who have certain high-risk conditions should receive one or more doses of vaccine.  Hepatitis A vaccine. Adults who wish to be protected from this disease, have certain high-risk conditions, work with hepatitis A-infected animals, work in hepatitis A research labs, or travel to or work in countries with a high rate of hepatitis A should be immunized. Adults who were previously unvaccinated and who anticipate close contact with an international adoptee during the first 60 days after arrival in the [place] from a country with a high rate of hepatitis A should be immunized.  Hepatitis B vaccine. Adults who wish to be protected from this disease, have certain high-risk conditions, may be exposed to blood or other infectious body fluids, are household contacts or sex partners of hepatitis B positive people, are clients or workers in certain care facilities, or travel to or work in countries with a high rate of hepatitis B should be immunized.  Haemophilus influenzae type b (Hib) vaccine. A previously unvaccinated person with asplenia or sickle cell disease or having a scheduled splenectomy should receive 1 dose of Hib vaccine. Regardless of previous immunization, a recipient of a hematopoietic stem cell transplant should receive a 3-dose series 6-12 months after her successful transplant. Hib vaccine is not recommended for adults with HIV infection. Preventive Services / Frequency Ages 64 to 68 years  Blood pressure check.** / Every 1 to 2 years.  Lipid and cholesterol check.** / Every 5 years beginning at age  22.  Clinical breast exam.** / Every 3 years for women in their 88s and 53s.  BRCA-related cancer risk assessment.** / For women who have family members with a BRCA-related cancer (breast, ovarian, tubal, or peritoneal cancers).  Pap test.** / Every 2 years from ages 90 through 51. Every 3 years starting at age 21 through age 56 or 3 with a history of 3 consecutive normal Pap tests.  HPV screening.** / Every 3 years from ages 24 through ages 1 to 46 with a history of 3 consecutive normal Pap tests.  Hepatitis C blood test.** / For any individual with known risks for hepatitis C.  Skin self-exam. / Monthly.  Influenza vaccine. / Every year.  Tetanus, diphtheria, and acellular pertussis (Tdap, Td) vaccine.** / Consult your health care provider. Pregnant women should receive 1 dose of Tdap vaccine during each pregnancy. 1 dose of Td every 10 years.  Varicella vaccine.** / Consult your health care provider. Pregnant females who do not have evidence of immunity should receive the first dose after pregnancy.  HPV vaccine. / 3 doses over 6 months, if 72 and younger. The vaccine is not recommended for use in pregnant females. However, pregnancy testing is not needed before receiving a dose.  Measles, mumps, rubella (MMR) vaccine.** / You need at least 1 dose of MMR if you were born in 1957 or later. You may also need a 2nd dose. For females of childbearing age, rubella immunity should be determined. If there is no evidence of immunity, females who are not pregnant should be vaccinated. If there is no evidence of immunity, females who are pregnant should delay immunization until after pregnancy.  Pneumococcal 13-valent conjugate (PCV13) vaccine.** / Consult your health care provider.  Pneumococcal polysaccharide (PPSV23) vaccine.** / 1 to 2 doses if you smoke cigarettes or if you have certain conditions.  Meningococcal vaccine.** /  1 dose if you are age 19 to 21 years and a first-year college  student living in a residence hall, or have one of several medical conditions, you need to get vaccinated against meningococcal disease. You may also need additional booster doses.  Hepatitis A vaccine.** / Consult your health care provider.  Hepatitis B vaccine.** / Consult your health care provider.  Haemophilus influenzae type b (Hib) vaccine.** / Consult your health care provider. Ages 40 to 64 years  Blood pressure check.** / Every 1 to 2 years.  Lipid and cholesterol check.** / Every 5 years beginning at age 20 years.  Lung cancer screening. / Every year if you are aged 55-80 years and have a 30-pack-year history of smoking and currently smoke or have quit within the past 15 years. Yearly screening is stopped once you have quit smoking for at least 15 years or develop a health problem that would prevent you from having lung cancer treatment.  Clinical breast exam.** / Every year after age 40 years.  BRCA-related cancer risk assessment.** / For women who have family members with a BRCA-related cancer (breast, ovarian, tubal, or peritoneal cancers).  Mammogram.** / Every year beginning at age 40 years and continuing for as long as you are in good health. Consult with your health care provider.  Pap test.** / Every 3 years starting at age 30 years through age 65 or 70 years with a history of 3 consecutive normal Pap tests.  HPV screening.** / Every 3 years from ages 30 years through ages 65 to 70 years with a history of 3 consecutive normal Pap tests.  Fecal occult blood test (FOBT) of stool. / Every year beginning at age 50 years and continuing until age 75 years. You may not need to do this test if you get a colonoscopy every 10 years.  Flexible sigmoidoscopy or colonoscopy.** / Every 5 years for a flexible sigmoidoscopy or every 10 years for a colonoscopy beginning at age 50 years and continuing until age 75 years.  Hepatitis C blood test.** / For all people born from 1945 through  1965 and any individual with known risks for hepatitis C.  Skin self-exam. / Monthly.  Influenza vaccine. / Every year.  Tetanus, diphtheria, and acellular pertussis (Tdap/Td) vaccine.** / Consult your health care provider. Pregnant women should receive 1 dose of Tdap vaccine during each pregnancy. 1 dose of Td every 10 years.  Varicella vaccine.** / Consult your health care provider. Pregnant females who do not have evidence of immunity should receive the first dose after pregnancy.  Zoster vaccine.** / 1 dose for adults aged 60 years or older.  Measles, mumps, rubella (MMR) vaccine.** / You need at least 1 dose of MMR if you were born in 1957 or later. You may also need a 2nd dose. For females of childbearing age, rubella immunity should be determined. If there is no evidence of immunity, females who are not pregnant should be vaccinated. If there is no evidence of immunity, females who are pregnant should delay immunization until after pregnancy.  Pneumococcal 13-valent conjugate (PCV13) vaccine.** / Consult your health care provider.  Pneumococcal polysaccharide (PPSV23) vaccine.** / 1 to 2 doses if you smoke cigarettes or if you have certain conditions.  Meningococcal vaccine.** / Consult your health care provider.  Hepatitis A vaccine.** / Consult your health care provider.  Hepatitis B vaccine.** / Consult your health care provider.  Haemophilus influenzae type b (Hib) vaccine.** / Consult your health care provider. Ages 65   years and over  Blood pressure check.** / Every 1 to 2 years.  Lipid and cholesterol check.** / Every 5 years beginning at age 22 years.  Lung cancer screening. / Every year if you are aged 73-80 years and have a 30-pack-year history of smoking and currently smoke or have quit within the past 15 years. Yearly screening is stopped once you have quit smoking for at least 15 years or develop a health problem that would prevent you from having lung cancer  treatment.  Clinical breast exam.** / Every year after age 4 years.  BRCA-related cancer risk assessment.** / For women who have family members with a BRCA-related cancer (breast, ovarian, tubal, or peritoneal cancers).  Mammogram.** / Every year beginning at age 40 years and continuing for as long as you are in good health. Consult with your health care provider.  Pap test.** / Every 3 years starting at age 9 years through age 34 or 91 years with 3 consecutive normal Pap tests. Testing can be stopped between 65 and 70 years with 3 consecutive normal Pap tests and no abnormal Pap or HPV tests in the past 10 years.  HPV screening.** / Every 3 years from ages 57 years through ages 64 or 45 years with a history of 3 consecutive normal Pap tests. Testing can be stopped between 65 and 70 years with 3 consecutive normal Pap tests and no abnormal Pap or HPV tests in the past 10 years.  Fecal occult blood test (FOBT) of stool. / Every year beginning at age 15 years and continuing until age 17 years. You may not need to do this test if you get a colonoscopy every 10 years.  Flexible sigmoidoscopy or colonoscopy.** / Every 5 years for a flexible sigmoidoscopy or every 10 years for a colonoscopy beginning at age 86 years and continuing until age 71 years.  Hepatitis C blood test.** / For all people born from [birth year] through 1967 and any individual with known risks for hepatitis C.  Osteoporosis screening.** / A one-time screening for women ages 83 years and over and women at risk for fractures or osteoporosis.  Skin self-exam. / Monthly.  Influenza vaccine. / Every year.  Tetanus, diphtheria, and acellular pertussis (Tdap/Td) vaccine.** / 1 dose of Td every 10 years.  Varicella vaccine.** / Consult your health care provider.  Zoster vaccine.** / 1 dose for adults aged 61 years or older.  Pneumococcal 13-valent conjugate (PCV13) vaccine.** / Consult your health care provider.  Pneumococcal  polysaccharide (PPSV23) vaccine.** / 1 dose for all adults aged 28 years and older.  Meningococcal vaccine.** / Consult your health care provider.  Hepatitis A vaccine.** / Consult your health care provider.  Hepatitis B vaccine.** / Consult your health care provider.  Haemophilus influenzae type b (Hib) vaccine.** / Consult your health care provider. ** Family history and personal history of risk and conditions may change your health care provider's recommendations. Document Released: 02/27/2001 Document Revised: 05/18/2013 Document Reviewed: 05/29/2010 [facility] Patient Information 2015 [place], [place]. This information is not intended to replace advice given to you by your health care provider. Make sure you discuss any questions you have with your health care provider.

## 2014-08-20 LAB — URINE CULTURE
Colony Count: NO GROWTH
ORGANISM ID, BACTERIA: NO GROWTH

## 2014-08-23 ENCOUNTER — Encounter: Payer: Self-pay | Admitting: *Deleted

## 2014-09-01 ENCOUNTER — Ambulatory Visit: Payer: BC Managed Care – PPO

## 2014-09-13 ENCOUNTER — Other Ambulatory Visit: Payer: Self-pay | Admitting: Family Medicine

## 2014-09-26 ENCOUNTER — Other Ambulatory Visit: Payer: Self-pay | Admitting: Family Medicine

## 2014-11-12 ENCOUNTER — Other Ambulatory Visit: Payer: Self-pay | Admitting: Family Medicine

## 2014-11-25 ENCOUNTER — Encounter: Payer: Self-pay | Admitting: Family Medicine

## 2015-01-18 ENCOUNTER — Telehealth: Payer: Self-pay | Admitting: Neurology

## 2015-01-18 ENCOUNTER — Other Ambulatory Visit: Payer: Self-pay

## 2015-01-18 MED ORDER — MEMANTINE HCL 10 MG PO TABS: 10.0000 mg | ORAL_TABLET | Freq: Two times a day (BID) | ORAL | Status: DC

## 2015-01-18 MED ORDER — DONEPEZIL HCL 10 MG PO TABS: 10.0000 mg | ORAL_TABLET | Freq: Every day | ORAL | Status: DC

## 2015-01-18 NOTE — Telephone Encounter (Signed)
Patient's husband Onalee HuaDavid is calling and states that the copay for the Namzaric 28-10 mg this year is about $400.  Since this is a combination of Memantine HCI and Donepezil HCI he would like to have the 2 Rx changed to individual Rx which are about $5.00 each and more manageable financially.  Thanks!

## 2015-01-18 NOTE — Telephone Encounter (Signed)
Jennifer Small: It is okay to change to generic Namenda 10 mg twice a day, and Aricept 10 mg daily

## 2015-01-18 NOTE — Telephone Encounter (Signed)
I called back and relayed providers message.  They expressed understanding and appreciation.

## 2015-01-25 ENCOUNTER — Other Ambulatory Visit: Payer: Self-pay | Admitting: Family Medicine

## 2015-01-25 ENCOUNTER — Telehealth: Payer: Self-pay | Admitting: Family Medicine

## 2015-01-25 ENCOUNTER — Other Ambulatory Visit (INDEPENDENT_AMBULATORY_CARE_PROVIDER_SITE_OTHER): Payer: BC Managed Care – PPO

## 2015-01-25 DIAGNOSIS — F0391 Unspecified dementia with behavioral disturbance: Secondary | ICD-10-CM | POA: Diagnosis not present

## 2015-01-25 DIAGNOSIS — F03918 Unspecified dementia, unspecified severity, with other behavioral disturbance: Secondary | ICD-10-CM

## 2015-01-25 NOTE — Telephone Encounter (Signed)
Is this the correct test and what Diagnosis do you want to use?  KP

## 2015-01-25 NOTE — Telephone Encounter (Signed)
Order in.

## 2015-01-25 NOTE — Telephone Encounter (Signed)
Ok to come in for lyme titre

## 2015-01-25 NOTE — Telephone Encounter (Signed)
Please advise      KP 

## 2015-01-25 NOTE — Telephone Encounter (Signed)
Patient is aware and they will come in today at 3:30 pm.   KP

## 2015-01-25 NOTE — Telephone Encounter (Signed)
Caller name:Vanwey,David Relation to pt: spouse  Call back number:[phone]  Reason for call:  Spouse states patient was diagnosed with dementia and he wanted to know if she can be tested for lyme disease due to them living in [place] for many years when the deer tick. Spouse would like to exhaust all options.

## 2015-02-01 LAB — LYME, IGM, EARLY TEST/REFLEX

## 2015-02-07 DIAGNOSIS — Z0289 Encounter for other administrative examinations: Secondary | ICD-10-CM

## 2015-02-08 ENCOUNTER — Telehealth: Payer: Self-pay | Admitting: *Deleted

## 2015-02-08 NOTE — Telephone Encounter (Signed)
Received fax paperwork from [employer] [facility] [facility] for Medical Records to determine Disability Services; forwarded to jordan for scan/Email/SLS 01/24  

## 2015-03-08 ENCOUNTER — Encounter: Payer: Self-pay | Admitting: Family Medicine

## 2015-03-08 ENCOUNTER — Ambulatory Visit (INDEPENDENT_AMBULATORY_CARE_PROVIDER_SITE_OTHER): Payer: BC Managed Care – PPO | Admitting: Family Medicine

## 2015-03-08 VITALS — BP 122/80 | HR 60 | Temp 98.0°F | Wt 153.0 lb

## 2015-03-08 DIAGNOSIS — E781 Pure hyperglyceridemia: Secondary | ICD-10-CM | POA: Diagnosis not present

## 2015-03-08 DIAGNOSIS — E785 Hyperlipidemia, unspecified: Secondary | ICD-10-CM

## 2015-03-08 DIAGNOSIS — Z1159 Encounter for screening for other viral diseases: Secondary | ICD-10-CM

## 2015-03-08 DIAGNOSIS — Z23 Encounter for immunization: Secondary | ICD-10-CM

## 2015-03-08 DIAGNOSIS — T148 Other injury of unspecified body region: Secondary | ICD-10-CM | POA: Diagnosis not present

## 2015-03-08 DIAGNOSIS — E039 Hypothyroidism, unspecified: Secondary | ICD-10-CM | POA: Diagnosis not present

## 2015-03-08 DIAGNOSIS — Z114 Encounter for screening for human immunodeficiency virus [HIV]: Secondary | ICD-10-CM

## 2015-03-08 DIAGNOSIS — W57XXXA Bitten or stung by nonvenomous insect and other nonvenomous arthropods, initial encounter: Secondary | ICD-10-CM

## 2015-03-08 MED ORDER — FENOFIBRATE 160 MG PO TABS: 160.0000 mg | ORAL_TABLET | Freq: Every day | ORAL | Status: DC

## 2015-03-08 MED ORDER — ATORVASTATIN CALCIUM 20 MG PO TABS: 20.0000 mg | ORAL_TABLET | Freq: Every day | ORAL | Status: DC

## 2015-03-08 MED ORDER — SYNTHROID 112 MCG PO TABS: ORAL_TABLET | ORAL | Status: DC

## 2015-03-08 NOTE — Patient Instructions (Signed)

## 2015-03-08 NOTE — Progress Notes (Signed)
Patient ID: Jennifer Small, female    DOB: 31-Aug-1955  Age: 60 y.o. MRN: [id number]    Subjective:  Subjective HPI Jennifer Small presents for f/u cholesterol and thyroid.  Her husband is with her.  Memory is stable.  Pt sits quietly most of the visit.  Answers questions with simple yes or no.    Review of Systems  Constitutional: Negative for diaphoresis, appetite change, fatigue and unexpected weight change.  Eyes: Negative for pain, redness and visual disturbance.  Respiratory: Negative for cough, chest tightness, shortness of breath and wheezing.   Cardiovascular: Negative for chest pain, palpitations and leg swelling.  Endocrine: Negative for cold intolerance, heat intolerance, polydipsia, polyphagia and polyuria.  Genitourinary: Negative for dysuria, frequency and difficulty urinating.  Neurological: Negative for dizziness, light-headedness, numbness and headaches.    History Past Medical History  Diagnosis Date  . Thyroid disease     Hypothyroidism  . Osteoporosis   . Hypothyroidism   . Memory loss   . Dementia     She has past surgical history that includes Cesarean section and Breast lumpectomy.   Her family history includes Arthritis in her father; Down syndrome in her brother.She reports that she has never smoked. She has never used smokeless tobacco. She reports that she drinks about 0.5 oz of alcohol per week. She reports that she does not use illicit drugs.  Current Outpatient Prescriptions on File Prior to Visit  Medication Sig Dispense Refill  . aspirin 81 MG tablet Take 81 mg by mouth daily.    Marland Kitchen donepezil (ARICEPT) 10 MG tablet Take 1 tablet (10 mg total) by mouth daily. 30 tablet 6  . memantine (NAMENDA) 10 MG tablet Take 1 tablet (10 mg total) by mouth 2 (two) times daily. 60 tablet 6  . Multiple Vitamin (MULTIVITAMIN) tablet Take 1 tablet by mouth daily.      . raloxifene (EVISTA) 60 MG tablet Take 1 tablet (60 mg total) by mouth daily. 90 tablet 1   No  current facility-administered medications on file prior to visit.     Objective:  Objective Physical Exam  Constitutional: She appears well-developed and well-nourished.  HENT:  Head: Normocephalic and atraumatic.  Eyes: Conjunctivae and EOM are normal.  Neck: Normal range of motion. Neck supple. No JVD present. Carotid bruit is not present. No thyromegaly present.  Cardiovascular: Normal rate, regular rhythm and normal heart sounds.   No murmur heard. Pulmonary/Chest: Effort normal and breath sounds normal. No respiratory distress. She has no wheezes. She has no rales. She exhibits no tenderness.  Musculoskeletal: She exhibits no edema.  Neurological: She is alert.  Psychiatric: Her affect is blunt. Thought content is not delusional. Cognition and memory are impaired. She expresses no suicidal ideation. She expresses no suicidal plans and no homicidal plans.  Nursing note and vitals reviewed.  BP 122/80 mmHg  Pulse 60  Temp(Src) 98 F (36.7 C) (Oral)  Wt 153 lb (69.4 kg)  SpO2 97% Wt Readings from Last 3 Encounters:  03/08/15 153 lb (69.4 kg)  08/19/14 144 lb 9.6 oz (65.59 kg)  07/29/14 146 lb (66.225 kg)     Lab Results  Component Value Date   WBC 7.9 08/19/2014   HGB 15.0 08/19/2014   HCT 44.3 08/19/2014   PLT 280.0 08/19/2014   GLUCOSE 106* 08/19/2014   CHOL 167 08/19/2014   TRIG 111.0 08/19/2014   HDL 55.30 08/19/2014   LDLDIRECT 108.8 06/26/2012   LDLCALC 89 08/19/2014   ALT 25 08/19/2014  AST 30 08/19/2014   NA 143 08/19/2014   K 3.9 08/19/2014   CL 106 08/19/2014   CREATININE 0.90 08/19/2014   BUN 14 08/19/2014   CO2 30 08/19/2014   TSH 1.45 08/19/2014   HGBA1C 5.6 08/19/2014   MICROALBUR 1.7 06/26/2012    No results found.   Assessment & Plan:  Plan I am having Jennifer Small maintain her multivitamin, aspirin, raloxifene, donepezil, memantine, atorvastatin, fenofibrate, and SYNTHROID.  Meds ordered this encounter  Medications  . atorvastatin  (LIPITOR) 20 MG tablet    Sig: Take 1 tablet (20 mg total) by mouth at bedtime.    Dispense:  90 tablet    Refill:  1  . fenofibrate 160 MG tablet    Sig: Take 1 tablet (160 mg total) by mouth daily.    Dispense:  90 tablet    Refill:  1  . SYNTHROID 112 MCG tablet    Sig: TAKE 1 TABLET BY MOUTH EVERY MORNING BEFORE BREAKFAST    Dispense:  30 tablet    Refill:  11    Problem List Items Addressed This Visit      Unprioritized   Hyperlipidemia - Primary   Relevant Medications   atorvastatin (LIPITOR) 20 MG tablet   fenofibrate 160 MG tablet   Other Relevant Orders   Lipid panel    Other Visit Diagnoses    High triglycerides        Relevant Medications    atorvastatin (LIPITOR) 20 MG tablet    fenofibrate 160 MG tablet    Hypothyroidism, unspecified hypothyroidism type        Relevant Medications    SYNTHROID 112 MCG tablet    Tick bite        Relevant Orders    Lyme Aby, Western Blot IgG & IgM w/bands    Need for hepatitis C screening test        Relevant Orders    Hepatitis C antibody    Screening for HIV (human immunodeficiency virus)        Relevant Orders    HIV antibody    Need for influenza vaccination        Relevant Orders    Flu Vaccine QUAD 36+ mos PF IM (Fluarix & Fluzone Quad PF) (Completed)       Follow-up: Return in about 6 months (around 09/05/2015), or if symptoms worsen or fail to improve, for hyperlipidemia.  Loreen Freud, DO

## 2015-03-08 NOTE — Progress Notes (Signed)
Pre visit review using our clinic review tool, if applicable. No additional management support is needed unless otherwise documented below in the visit note. 

## 2015-03-09 LAB — HIV ANTIBODY (ROUTINE TESTING W REFLEX): HIV 1&2 Ab, 4th Generation: NONREACTIVE

## 2015-03-09 LAB — HEPATITIS C ANTIBODY: HCV AB: NEGATIVE

## 2015-03-10 LAB — LYME ABY, WSTRN BLT IGG & IGM W/BANDS
B BURGDORFERI IGM ABS (IB): NEGATIVE
B burgdorferi IgG Abs (IB): NEGATIVE
LYME DISEASE 23 KD IGM: NONREACTIVE
LYME DISEASE 30 KD IGG: NONREACTIVE
LYME DISEASE 41 KD IGM: REACTIVE — AB
LYME DISEASE 45 KD IGG: NONREACTIVE
LYME DISEASE 58 KD IGG: NONREACTIVE
LYME DISEASE 93 KD IGG: NONREACTIVE
Lyme Disease 18 kD IgG: NONREACTIVE
Lyme Disease 23 kD IgG: NONREACTIVE
Lyme Disease 28 kD IgG: NONREACTIVE
Lyme Disease 39 kD IgG: NONREACTIVE
Lyme Disease 39 kD IgM: NONREACTIVE
Lyme Disease 41 kD IgG: NONREACTIVE
Lyme Disease 66 kD IgG: NONREACTIVE

## 2015-03-11 ENCOUNTER — Telehealth: Payer: Self-pay | Admitting: Family Medicine

## 2015-03-11 NOTE — Telephone Encounter (Signed)
Caller name: Onalee Hua Relationship to patient: Husband Can be reached: [phone]   Reason for call: Husband request call back about patients [employer] forms that have not been returned to the [facility]. States that they are in the process of closing her case because these forms have not been sent back. Transferred to Medical Records but he states that they doctor is responsible for getting this done and it should have already been done.

## 2015-03-11 NOTE — Telephone Encounter (Signed)
I have only received request for medical records:  Regis Bill, CMA at 02/08/2015 11:12 AM     Status: Signed       Expand All Collapse All   Received fax paperwork from [facility] for Medical Records to determine Disability Services; forwarded to [place] for scan/Email/SLS 01/24

## 2015-03-11 NOTE — Telephone Encounter (Signed)
Please advise 

## 2015-03-13 ENCOUNTER — Other Ambulatory Visit: Payer: Self-pay | Admitting: Family Medicine

## 2015-03-14 NOTE — Telephone Encounter (Signed)
Jennifer Small, please follow up with patient.

## 2015-03-15 ENCOUNTER — Telehealth: Payer: Self-pay | Admitting: Family Medicine

## 2015-03-15 NOTE — Telephone Encounter (Signed)
Regis Bill, CMA at 02/08/2015 11:12 AM     Status: Signed       Expand All Collapse All   Received fax paperwork from [facility] for Medical Records to determine Disability Services; forwarded to [place] for scan/Email/SLS 01/24      Do not have any f/u; forward to [place] for Medical Records/SLS

## 2015-03-15 NOTE — Telephone Encounter (Signed)
Spoke with husband Onalee Hua (re phone note 03/11/15) and assured him we are still working on his wife's request. Per Pam in Medical Records they did receive the request from our office and they reached out to [facility] on 03/01/15 for a date range needed for the patient. Husband is aware and agreeable that we are working on his wife's request, he will follow up with me when he is hears back from rep. At [facility]. And if I receive anything further from them I will reach back out to him.

## 2015-03-15 NOTE — Telephone Encounter (Signed)
Spoke with husband and gave him the same process information in chart>we receive medical records request and forward to office manager>office manager forwards to Medical Records; pt states that Med Records told him, "when they send it, sometimes we get it & sometimes we don't"; apologized for his bad experience w/our med records and told him that I would be glad to let him speak with [place], he replied that he wanted to speak with Dr. Laury Axon and I informed him that Dr. Laury Axon does not see the medical records request that come either in person, in mail, or by fax. Patient was hollering on the phone, so I again gave him the option to speak with [place] and he then agreed and I transferred the call [It appears in pt's med records that Neurology had been completing these forms in the past d/t Dx: Dementia]/SLS 02/28

## 2015-04-07 ENCOUNTER — Other Ambulatory Visit: Payer: Self-pay | Admitting: Family Medicine

## 2015-08-01 ENCOUNTER — Ambulatory Visit (INDEPENDENT_AMBULATORY_CARE_PROVIDER_SITE_OTHER): Payer: BC Managed Care – PPO | Admitting: Nurse Practitioner

## 2015-08-01 ENCOUNTER — Encounter: Payer: Self-pay | Admitting: Nurse Practitioner

## 2015-08-01 VITALS — BP 142/80 | HR 68 | Ht 62.0 in | Wt 152.8 lb

## 2015-08-01 DIAGNOSIS — R413 Other amnesia: Secondary | ICD-10-CM | POA: Diagnosis not present

## 2015-08-01 MED ORDER — MEMANTINE HCL 10 MG PO TABS: 10.0000 mg | ORAL_TABLET | Freq: Two times a day (BID) | ORAL | Status: DC

## 2015-08-01 MED ORDER — DONEPEZIL HCL 10 MG PO TABS: 10.0000 mg | ORAL_TABLET | Freq: Every day | ORAL | Status: DC

## 2015-08-01 NOTE — Progress Notes (Signed)
[facility]  PATIENT: Jennifer Small DOB: [birth year]   REASON FOR VISIT: follow up for memory loss, central nervous system degenerative disorder Jennifer FROM: husband    Jennifer OF PRESENT ILLNESS: Jennifer Small is a 60 years old right-handed Caucasian female, accompanied by her husband, referred by her primary care physician Dr. Etter Sjogren for evaluation of memory loss since 2012. She has past medical Jennifer of hypothyroidism, on supplement, hyperlipidemia, she had college degree, majored in Pharmacologist, she is a Photographer for [employer], Since 2012, her husband noticed she has confusion episode, confused day of the week, do not know how to set up a clock, anxious, losing confident easily, getting worse over the past year, Herself denied any significant difficulty, denied difficulty handling her job, but getting very frustrated easily during office visit. She was diagnosed with hypothyroidism in early 2013, I don't know exact pathology, she was started on thyroid supplement, husband reported mild improvement  There was no significant family Jennifer of dementia,  MRI of the brain has demonstrated mild atrophy On follow up visit, she tends to be very agitated, refuse MMSE exam. CSF showed TP 45, Glucose67, VDRL-, ACE0, OCB-, wbc1/rbc 0,  EEG showed frequent spkie-slow burst, increased apperance during photic stimulation. Hyperventilation also induced prolonged long, abnormal generalied slow, spike slow activites. I have started her on Depakote xr, she could not tolerate clonazapam, has excessive drowsy. She was evaluated by Dr Valentina Shaggy in Early 2013, confirmed global and severe cognitive deterioration marked by impairments in multiple cognitive domains, including orientation, attention, mental processing speed, immediate memory, long term memory, language, visual-spatial oragnization, constructional praxis, and abstract reasoning. 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 Labs showed positive ANA, dsDNA, thyroid globulin antibody,elevated TSH 14.7, normal thyroid peroxidase, negative or normal CBC, CMP, HIV, Lyme, VitD, RPR, ESR, CPK, Vit B12, CRP. mildly elevated LDL 116, triglyceride 189, normal CBC, vitamin B12 1474,   UPDATE June 2014:[name] started her on a trial of prednisone up to 20 mg 2 tablets for one to 2 weeks in April 2014, without significant improvement, she was also evaluated by her rheumatologist Dr. Lenn Sink during that period of time, I do not have the feedback, but per patient, there was no definite connective tissue disease, patient failed to respond to prednisone treatment, it was tapered under the guidance of Dr. Luan Pulling in 2-3 weeks.She denies gait difficulty, on disability now, but confused easily, only drive very short distance,   UPDATE Oct 14th 2014:Jennifer Small is about the same, she is functional at home, she cooks some, she is home with dog, take dog for a walk. She has quit driving, she does not want to go to [facility] for second opinion UPDATE July 14th 2015:[name] husband retired, they are able to spend more time together, travels some, maintain the house, garden, she sleeps well, eats well, no vision difficulty no gait difficulty, no incontinence. She is taking Namenda, Aricept, Depakote ER 250 mg 2 tablets at night.  UPDATE 07/29/2014:CM Jennifer Small, 60 year old female returns for follow-up. She has memory loss for several years now. She is currently on Namzaric daily without side effects. Appetite is reportedly good and she sleeps well at night. She and her husband continue to exercise by walking every day. They continue to travel some. No falls no incontinence. Has an appointment the first of next month at [facility] for a second opinion. She returns for reevaluation UPDATE 7/17/2017CM Jennifer Small 60 year old female returns for follow-up follow-up.She  has a Jennifer of memory  Loss. She has been evaluated by Dr.  Burke at [facility] for second opinion, since last seen. She was on Namzaric daily but due to cost, switched back to Aricept 10 mg daily and Namenda 10 mg twice a day.She and her husband continue to walk every day.They continue to travel some.No recent falls, appetite is good.She returns for reevaluation  REVIEW OF SYSTEMS: Full 14 system review of systems performed and notable only for those listed, all others are neg:  Constitutional: neg  Cardiovascular: neg Ear/Nose/Throat: neg  Skin: neg Eyes: neg Respiratory: neg Gastroitestinal: neg  Hematology/Lymphatic: neg  Endocrine: neg Musculoskeletal:neg Allergy/Immunology: neg Neurological: memory loss speech difficulty Psychiatric: anxiety confusion Sleep : neg   ALLERGIES: No Known Allergies  HOME MEDICATIONS: Outpatient Prescriptions Prior to Visit  Medication Sig Dispense Refill  . aspirin 81 MG tablet Take 81 mg by mouth daily.    . atorvastatin (LIPITOR) 20 MG tablet TAKE 1 TABLET(20 MG) BY MOUTH AT BEDTIME 90 tablet 1  . donepezil (ARICEPT) 10 MG tablet Take 1 tablet (10 mg total) by mouth daily. 30 tablet 6  . fenofibrate 160 MG tablet Take 1 tablet (160 mg total) by mouth daily. 90 tablet 1  . memantine (NAMENDA) 10 MG tablet Take 1 tablet (10 mg total) by mouth 2 (two) times daily. 60 tablet 6  . Multiple Vitamin (MULTIVITAMIN) tablet Take 1 tablet by mouth daily.      . raloxifene (EVISTA) 60 MG tablet TAKE 1 TABLET(60 MG) BY MOUTH DAILY 90 tablet 1  . SYNTHROID 112 MCG tablet TAKE 1 TABLET BY MOUTH EVERY MORNING BEFORE BREAKFAST 30 tablet 11   No facility-administered medications prior to visit.    PAST MEDICAL Jennifer: Past Medical Jennifer  Diagnosis Date  . Thyroid disease     Hypothyroidism  . Osteoporosis   . Hypothyroidism   . Memory loss   . Dementia     PAST SURGICAL Jennifer: Past Surgical Jennifer  Procedure Laterality Date  . Cesarean section    . Breast lumpectomy      FAMILY  Jennifer: Family Jennifer  Problem Relation Age of Onset  . Arthritis Father   . Down syndrome Brother     SOCIAL Jennifer: Social Jennifer   Social Jennifer  . Marital Status: Married    Spouse Name: Dave  . Number of Children: 2  . Years of Education: college   Occupational Jennifer  .      disability  .  [employer]   Social Jennifer Main Topics  . Smoking status: Never Smoker   . Smokeless tobacco: Never Used  . Alcohol Use: 0.5 oz/week    1 drink(s) per week     Comment: rare  . Drug Use: No  . Sexual Activity:    Partners: Male   Other Topics Concern  . Not on file   Social Jennifer Narrative   GETS REG EXERCISE--walking   Patient lives at home with her husband (Dave. ).    Patient is on disability    Patient has college education.   Right handed.   Caffeine.- One or two cups daily.              PHYSICAL EXAM  Filed Vitals:   08/01/15 0954  BP: 142/80  Pulse: 68  Height: 5' 2" (1.575 m)  Weight: 152 lb 12.8 oz (69.31 kg)   Body mass index is 27.94 kg/(m^2). General: Pleasant in no acute distress   well groomed Neck: supple no carotid bruits Musculoskeletal no abnormalities  Neurologic Exam  Mental Status: anxious looking female, MMSE 8/30 Last 12/30, she is not oriented to time and place, has missed 3/3 recalls, could not spell WORLD backward, she could not copy figure nor write sentence Cranial Nerves: CN II-XII pupils were equal round reactive to light. Extraocular movements were full. Visual fields were full on confrontational test. Facial sensation and strength were normal. Hearing was intact to finger rubbing bilaterally. Uvula tongue were midline. Head turning and shoulder shrugging were normal and symmetric. Tongue protrusion into the cheeks strength were normal.  Motor: Normal tone, bulk, and strength. Sensory: Normal to light touch, pinprick,  and vibratory sensation. Coordination: There was no dysmetria noticed. Gait and  Station: Narrow based and steady Reflexes: Deep tendon reflexes: normal and symmetric.  DIAGNOSTIC DATA (LABS, IMAGING, TESTING) -  ASSESSMENT AND PLAN 60 y.o. year old female has a past medical Jennifer of Thyroid disease; short-term memory loss which has been progressive since 2012, elevated inflammatory markers including positive ANA and double-stranded DNA, thyroglobulin antibody, abnormal EEG with trial of prednisone without benefit. Her diagnosis is most consistent with central nervous system degenerative disorder.She has been evaluated by Dr. Lavone Neri at [facility] for second opinion   Continue Aricept at current dose will refill Continue Memantine at current dose will refill Continue exercising by walking every day Follow-up yearly next with Dr. Luan Pulling, [facility], [facility], APRN  [facility] [address] [place], [place] [address] [phone]

## 2015-08-01 NOTE — Patient Instructions (Signed)
Continue Aricept at current dose will refill Continue Memantine at current dose will refill Continue exercising by walking every day Follow-up yearly next with Dr. Terrace ArabiaYan

## 2015-08-02 NOTE — Progress Notes (Signed)
I have reviewed and agreed above plan. 

## 2015-09-03 ENCOUNTER — Other Ambulatory Visit: Payer: Self-pay | Admitting: Family Medicine

## 2015-09-03 DIAGNOSIS — E781 Pure hyperglyceridemia: Secondary | ICD-10-CM

## 2015-09-04 ENCOUNTER — Other Ambulatory Visit: Payer: Self-pay | Admitting: Family Medicine

## 2015-09-04 DIAGNOSIS — E785 Hyperlipidemia, unspecified: Secondary | ICD-10-CM

## 2015-09-09 ENCOUNTER — Ambulatory Visit (INDEPENDENT_AMBULATORY_CARE_PROVIDER_SITE_OTHER): Payer: BC Managed Care – PPO | Admitting: Family Medicine

## 2015-09-09 ENCOUNTER — Encounter: Payer: Self-pay | Admitting: Family Medicine

## 2015-09-09 VITALS — BP 114/70 | HR 71 | Temp 97.9°F | Ht 62.0 in | Wt 153.8 lb

## 2015-09-09 DIAGNOSIS — M8580 Other specified disorders of bone density and structure, unspecified site: Secondary | ICD-10-CM

## 2015-09-09 DIAGNOSIS — E039 Hypothyroidism, unspecified: Secondary | ICD-10-CM | POA: Diagnosis not present

## 2015-09-09 DIAGNOSIS — Z0000 Encounter for general adult medical examination without abnormal findings: Secondary | ICD-10-CM | POA: Diagnosis not present

## 2015-09-09 DIAGNOSIS — E785 Hyperlipidemia, unspecified: Secondary | ICD-10-CM | POA: Diagnosis not present

## 2015-09-09 DIAGNOSIS — E781 Pure hyperglyceridemia: Secondary | ICD-10-CM

## 2015-09-09 LAB — CBC WITH DIFFERENTIAL/PLATELET
BASOS ABS: 0.1 10*3/uL (ref 0.0–0.1)
Basophils Relative: 0.7 % (ref 0.0–3.0)
EOS ABS: 0.2 10*3/uL (ref 0.0–0.7)
Eosinophils Relative: 2.8 % (ref 0.0–5.0)
HCT: 44.5 % (ref 36.0–46.0)
Hemoglobin: 15.3 g/dL — ABNORMAL HIGH (ref 12.0–15.0)
LYMPHS ABS: 2.3 10*3/uL (ref 0.7–4.0)
Lymphocytes Relative: 27.2 % (ref 12.0–46.0)
MCHC: 34.3 g/dL (ref 30.0–36.0)
MCV: 92.3 fl (ref 78.0–100.0)
MONO ABS: 0.6 10*3/uL (ref 0.1–1.0)
MONOS PCT: 6.5 % (ref 3.0–12.0)
NEUTROS ABS: 5.4 10*3/uL (ref 1.4–7.7)
Neutrophils Relative %: 62.8 % (ref 43.0–77.0)
PLATELETS: 310 10*3/uL (ref 150.0–400.0)
RBC: 4.82 Mil/uL (ref 3.87–5.11)
RDW: 13.3 % (ref 11.5–15.5)
WBC: 8.5 10*3/uL (ref 4.0–10.5)

## 2015-09-09 LAB — COMPREHENSIVE METABOLIC PANEL
ALK PHOS: 55 U/L (ref 39–117)
ALT: 29 U/L (ref 0–35)
AST: 30 U/L (ref 0–37)
Albumin: 4.5 g/dL (ref 3.5–5.2)
BILIRUBIN TOTAL: 0.4 mg/dL (ref 0.2–1.2)
BUN: 16 mg/dL (ref 6–23)
CO2: 28 meq/L (ref 19–32)
Calcium: 9.6 mg/dL (ref 8.4–10.5)
Chloride: 106 mEq/L (ref 96–112)
Creatinine, Ser: 0.98 mg/dL (ref 0.40–1.20)
GFR: 61.02 mL/min (ref >60.00)
GLUCOSE: 109 mg/dL — AB (ref 70–99)
Potassium: 4 mEq/L (ref 3.5–5.1)
SODIUM: 142 meq/L (ref 135–145)
TOTAL PROTEIN: 7.3 g/dL (ref 6.0–8.3)

## 2015-09-09 LAB — LIPID PANEL
Cholesterol: 191 mg/dL (ref 0–200)
HDL: 61.4 mg/dL (ref >39.00)
LDL CALC: 101 mg/dL — AB (ref 0–99)
NONHDL: 129.82
TRIGLYCERIDES: 145 mg/dL (ref 0.0–149.0)
Total CHOL/HDL Ratio: 3
VLDL: 29 mg/dL (ref 0.0–40.0)

## 2015-09-09 LAB — TSH: TSH: 2.09 u[IU]/mL (ref 0.35–4.50)

## 2015-09-09 MED ORDER — FENOFIBRATE 160 MG PO TABS: ORAL_TABLET | ORAL | 1 refills | Status: DC

## 2015-09-09 MED ORDER — RALOXIFENE HCL 60 MG PO TABS: ORAL_TABLET | ORAL | 1 refills | Status: DC

## 2015-09-09 MED ORDER — SYNTHROID 112 MCG PO TABS: ORAL_TABLET | ORAL | 11 refills | Status: DC

## 2015-09-09 MED ORDER — ATORVASTATIN CALCIUM 20 MG PO TABS: ORAL_TABLET | ORAL | 1 refills | Status: DC

## 2015-09-09 NOTE — Patient Instructions (Signed)
Preventive Care for Adults, Female A healthy lifestyle and preventive care can promote health and wellness. Preventive health guidelines for women include the following key practices.  A routine yearly physical is a good way to check with your health care provider about your health and preventive screening. It is a chance to share any concerns and updates on your health and to receive a thorough exam.  Visit your dentist for a routine exam and preventive care every 6 months. Brush your teeth twice a day and floss once a day. Good oral hygiene prevents tooth decay and gum disease.  The frequency of eye exams is based on your age, health, family medical history, use of contact lenses, and other factors. Follow your health care provider's recommendations for frequency of eye exams.  Eat a healthy diet. Foods like vegetables, fruits, whole grains, low-fat dairy products, and lean protein foods contain the nutrients you need without too many calories. Decrease your intake of foods high in solid fats, added sugars, and salt. Eat the right amount of calories for you.Get information about a proper diet from your health care provider, if necessary.  Regular physical exercise is one of the most important things you can do for your health. Most adults should get at least 150 minutes of moderate-intensity exercise (any activity that increases your heart rate and causes you to sweat) each week. In addition, most adults need muscle-strengthening exercises on 2 or more days a week.  Maintain a healthy weight. The body mass index (BMI) is a screening tool to identify possible weight problems. It provides an estimate of body fat based on height and weight. Your health care provider can find your BMI and can help you achieve or maintain a healthy weight.For adults 20 years and older:  A BMI below 18.5 is considered underweight.  A BMI of 18.5 to 24.9 is normal.  A BMI of 25 to 29.9 is considered overweight.  A  BMI of 30 and above is considered obese.  Maintain normal blood lipids and cholesterol levels by exercising and minimizing your intake of saturated fat. Eat a balanced diet with plenty of fruit and vegetables. Blood tests for lipids and cholesterol should begin at age 45 and be repeated every 5 years. If your lipid or cholesterol levels are high, you are over 50, or you are at high risk for heart disease, you may need your cholesterol levels checked more frequently.Ongoing high lipid and cholesterol levels should be treated with medicines if diet and exercise are not working.  If you smoke, find out from your health care provider how to quit. If you do not use tobacco, do not start.  Lung cancer screening is recommended for adults aged 45-80 years who are at high risk for developing lung cancer because of a history of smoking. A yearly low-dose CT scan of the lungs is recommended for people who have at least a 30-pack-year history of smoking and are a current smoker or have quit within the past 15 years. A pack year of smoking is smoking an average of 1 pack of cigarettes a day for 1 year (for example: 1 pack a day for 30 years or 2 packs a day for 15 years). Yearly screening should continue until the smoker has stopped smoking for at least 15 years. Yearly screening should be stopped for people who develop a health problem that would prevent them from having lung cancer treatment.  If you are pregnant, do not drink alcohol. If you are  breastfeeding, be very cautious about drinking alcohol. If you are not pregnant and choose to drink alcohol, do not have more than 1 drink per day. One drink is considered to be 12 ounces (355 mL) of beer, 5 ounces (148 mL) of wine, or 1.5 ounces (44 mL) of liquor.  Avoid use of street drugs. Do not share needles with anyone. Ask for help if you need support or instructions about stopping the use of drugs.  High blood pressure causes heart disease and increases the risk  of stroke. Your blood pressure should be checked at least every 1 to 2 years. Ongoing high blood pressure should be treated with medicines if weight loss and exercise do not work.  If you are 55-79 years old, ask your health care provider if you should take aspirin to prevent strokes.  Diabetes screening is done by taking a blood sample to check your blood glucose level after you have not eaten for a certain period of time (fasting). If you are not overweight and you do not have risk factors for diabetes, you should be screened once every 3 years starting at age 45. If you are overweight or obese and you are 40-70 years of age, you should be screened for diabetes every year as part of your cardiovascular risk assessment.  Breast cancer screening is essential preventive care for women. You should practice "breast self-awareness." This means understanding the normal appearance and feel of your breasts and may include breast self-examination. Any changes detected, no matter how small, should be reported to a health care provider. Women in their 20s and 30s should have a clinical breast exam (CBE) by a health care provider as part of a regular health exam every 1 to 3 years. After age 40, women should have a CBE every year. Starting at age 40, women should consider having a mammogram (breast X-ray test) every year. Women who have a family history of breast cancer should talk to their health care provider about genetic screening. Women at a high risk of breast cancer should talk to their health care providers about having an MRI and a mammogram every year.  Breast cancer gene (BRCA)-related cancer risk assessment is recommended for women who have family members with BRCA-related cancers. BRCA-related cancers include breast, ovarian, tubal, and peritoneal cancers. Having family members with these cancers may be associated with an increased risk for harmful changes (mutations) in the breast cancer genes BRCA1 and  BRCA2. Results of the assessment will determine the need for genetic counseling and BRCA1 and BRCA2 testing.  Your health care provider may recommend that you be screened regularly for cancer of the pelvic organs (ovaries, uterus, and vagina). This screening involves a pelvic examination, including checking for microscopic changes to the surface of your cervix (Pap test). You may be encouraged to have this screening done every 3 years, beginning at age 21.  For women ages 30-65, health care providers may recommend pelvic exams and Pap testing every 3 years, or they may recommend the Pap and pelvic exam, combined with testing for human papilloma virus (HPV), every 5 years. Some types of HPV increase your risk of cervical cancer. Testing for HPV may also be done on women of any age with unclear Pap test results.  Other health care providers may not recommend any screening for nonpregnant women who are considered low risk for pelvic cancer and who do not have symptoms. Ask your health care provider if a screening pelvic exam is right for   you.  If you have had past treatment for cervical cancer or a condition that could lead to cancer, you need Pap tests and screening for cancer for at least 20 years after your treatment. If Pap tests have been discontinued, your risk factors (such as having a new sexual partner) need to be reassessed to determine if screening should resume. Some women have medical problems that increase the chance of getting cervical cancer. In these cases, your health care provider may recommend more frequent screening and Pap tests.  Colorectal cancer can be detected and often prevented. Most routine colorectal cancer screening begins at the age of 50 years and continues through age 75 years. However, your health care provider may recommend screening at an earlier age if you have risk factors for colon cancer. On a yearly basis, your health care provider may provide home test kits to check  for hidden blood in the stool. Use of a small camera at the end of a tube, to directly examine the colon (sigmoidoscopy or colonoscopy), can detect the earliest forms of colorectal cancer. Talk to your health care provider about this at age 50, when routine screening begins. Direct exam of the colon should be repeated every 5-10 years through age 75 years, unless early forms of precancerous polyps or small growths are found.  People who are at an increased risk for hepatitis B should be screened for this virus. You are considered at high risk for hepatitis B if:  You were born in a country where hepatitis B occurs often. Talk with your health care provider about which countries are considered high risk.  Your parents were born in a high-risk country and you have not received a shot to protect against hepatitis B (hepatitis B vaccine).  You have HIV or AIDS.  You use needles to inject street drugs.  You live with, or have sex with, someone who has hepatitis B.  You get hemodialysis treatment.  You take certain medicines for conditions like cancer, organ transplantation, and autoimmune conditions.  Hepatitis C blood testing is recommended for all people born from 1945 through 1965 and any individual with known risks for hepatitis C.  Practice safe sex. Use condoms and avoid high-risk sexual practices to reduce the spread of sexually transmitted infections (STIs). STIs include gonorrhea, chlamydia, syphilis, trichomonas, herpes, HPV, and human immunodeficiency virus (HIV). Herpes, HIV, and HPV are viral illnesses that have no cure. They can result in disability, cancer, and death.  You should be screened for sexually transmitted illnesses (STIs) including gonorrhea and chlamydia if:  You are sexually active and are younger than 24 years.  You are older than 24 years and your health care provider tells you that you are at risk for this type of infection.  Your sexual activity has changed  since you were last screened and you are at an increased risk for chlamydia or gonorrhea. Ask your health care provider if you are at risk.  If you are at risk of being infected with HIV, it is recommended that you take a prescription medicine daily to prevent HIV infection. This is called preexposure prophylaxis (PrEP). You are considered at risk if:  You are sexually active and do not regularly use condoms or know the HIV status of your partner(s).  You take drugs by injection.  You are sexually active with a partner who has HIV.  Talk with your health care provider about whether you are at high risk of being infected with HIV. If   you choose to begin PrEP, you should first be tested for HIV. You should then be tested every 3 months for as long as you are taking PrEP.  Osteoporosis is a disease in which the bones lose minerals and strength with aging. This can result in serious bone fractures or breaks. The risk of osteoporosis can be identified using a bone density scan. Women ages 67 years and over and women at risk for fractures or osteoporosis should discuss screening with their health care providers. Ask your health care provider whether you should take a calcium supplement or vitamin D to reduce the rate of osteoporosis.  Menopause can be associated with physical symptoms and risks. Hormone replacement therapy is available to decrease symptoms and risks. You should talk to your health care provider about whether hormone replacement therapy is right for you.  Use sunscreen. Apply sunscreen liberally and repeatedly throughout the day. You should seek shade when your shadow is shorter than you. Protect yourself by wearing long sleeves, pants, a wide-brimmed hat, and sunglasses year round, whenever you are outdoors.  Once a month, do a whole body skin exam, using a mirror to look at the skin on your back. Tell your health care provider of new moles, moles that have irregular borders, moles that  are larger than a pencil eraser, or moles that have changed in shape or color.  Stay current with required vaccines (immunizations).  Influenza vaccine. All adults should be immunized every year.  Tetanus, diphtheria, and acellular pertussis (Td, Tdap) vaccine. Pregnant women should receive 1 dose of Tdap vaccine during each pregnancy. The dose should be obtained regardless of the length of time since the last dose. Immunization is preferred during the 27th-36th week of gestation. An adult who has not previously received Tdap or who does not know her vaccine status should receive 1 dose of Tdap. This initial dose should be followed by tetanus and diphtheria toxoids (Td) booster doses every 10 years. Adults with an unknown or incomplete history of completing a 3-dose immunization series with Td-containing vaccines should begin or complete a primary immunization series including a Tdap dose. Adults should receive a Td booster every 10 years.  Varicella vaccine. An adult without evidence of immunity to varicella should receive 2 doses or a second dose if she has previously received 1 dose. Pregnant females who do not have evidence of immunity should receive the first dose after pregnancy. This first dose should be obtained before leaving the health care facility. The second dose should be obtained 4-8 weeks after the first dose.  Human papillomavirus (HPV) vaccine. Females aged 13-26 years who have not received the vaccine previously should obtain the 3-dose series. The vaccine is not recommended for use in pregnant females. However, pregnancy testing is not needed before receiving a dose. If a female is found to be pregnant after receiving a dose, no treatment is needed. In that case, the remaining doses should be delayed until after the pregnancy. Immunization is recommended for any person with an immunocompromised condition through the age of 61 years if she did not get any or all doses earlier. During the  3-dose series, the second dose should be obtained 4-8 weeks after the first dose. The third dose should be obtained 24 weeks after the first dose and 16 weeks after the second dose.  Zoster vaccine. One dose is recommended for adults aged 30 years or older unless certain conditions are present.  Measles, mumps, and rubella (MMR) vaccine. Adults born  before 1957 generally are considered immune to measles and mumps. Adults born in 1957 or later should have 1 or more doses of MMR vaccine unless there is a contraindication to the vaccine or there is laboratory evidence of immunity to each of the three diseases. A routine second dose of MMR vaccine should be obtained at least 28 days after the first dose for students attending postsecondary schools, health care workers, or international travelers. People who received inactivated measles vaccine or an unknown type of measles vaccine during 1963-1967 should receive 2 doses of MMR vaccine. People who received inactivated mumps vaccine or an unknown type of mumps vaccine before 1979 and are at high risk for mumps infection should consider immunization with 2 doses of MMR vaccine. For females of childbearing age, rubella immunity should be determined. If there is no evidence of immunity, females who are not pregnant should be vaccinated. If there is no evidence of immunity, females who are pregnant should delay immunization until after pregnancy. Unvaccinated health care workers born before 1957 who lack laboratory evidence of measles, mumps, or rubella immunity or laboratory confirmation of disease should consider measles and mumps immunization with 2 doses of MMR vaccine or rubella immunization with 1 dose of MMR vaccine.  Pneumococcal 13-valent conjugate (PCV13) vaccine. When indicated, a person who is uncertain of his immunization history and has no record of immunization should receive the PCV13 vaccine. All adults 65 years of age and older should receive this  vaccine. An adult aged 19 years or older who has certain medical conditions and has not been previously immunized should receive 1 dose of PCV13 vaccine. This PCV13 should be followed with a dose of pneumococcal polysaccharide (PPSV23) vaccine. Adults who are at high risk for pneumococcal disease should obtain the PPSV23 vaccine at least 8 weeks after the dose of PCV13 vaccine. Adults older than 62 years of age who have normal immune system function should obtain the PPSV23 vaccine dose at least 1 year after the dose of PCV13 vaccine.  Pneumococcal polysaccharide (PPSV23) vaccine. When PCV13 is also indicated, PCV13 should be obtained first. All adults aged 65 years and older should be immunized. An adult younger than age 65 years who has certain medical conditions should be immunized. Any person who resides in a nursing home or long-term care facility should be immunized. An adult smoker should be immunized. People with an immunocompromised condition and certain other conditions should receive both PCV13 and PPSV23 vaccines. People with human immunodeficiency virus (HIV) infection should be immunized as soon as possible after diagnosis. Immunization during chemotherapy or radiation therapy should be avoided. Routine use of PPSV23 vaccine is not recommended for American Indians, [place] Natives, or people younger than 65 years unless there are medical conditions that require PPSV23 vaccine. When indicated, people who have unknown immunization and have no record of immunization should receive PPSV23 vaccine. One-time revaccination 5 years after the first dose of PPSV23 is recommended for people aged 19-64 years who have chronic kidney failure, nephrotic syndrome, asplenia, or immunocompromised conditions. People who received 1-2 doses of PPSV23 before age 65 years should receive another dose of PPSV23 vaccine at age 65 years or later if at least 5 years have passed since the previous dose. Doses of PPSV23 are not  needed for people immunized with PPSV23 at or after age 65 years.  Meningococcal vaccine. Adults with asplenia or persistent complement component deficiencies should receive 2 doses of quadrivalent meningococcal conjugate (MenACWY-D) vaccine. The doses should be obtained   at least 2 months apart. Microbiologists working with certain meningococcal bacteria, [place] recruits, people at risk during an outbreak, and people who travel to or live in countries with a high rate of meningitis should be immunized. A first-year college student up through age 34 years who is living in a residence hall should receive a dose if she did not receive a dose on or after her 16th birthday. Adults who have certain high-risk conditions should receive one or more doses of vaccine.  Hepatitis A vaccine. Adults who wish to be protected from this disease, have certain high-risk conditions, work with hepatitis A-infected animals, work in hepatitis A research labs, or travel to or work in countries with a high rate of hepatitis A should be immunized. Adults who were previously unvaccinated and who anticipate close contact with an international adoptee during the first 60 days after arrival in the [place] from a country with a high rate of hepatitis A should be immunized.  Hepatitis B vaccine. Adults who wish to be protected from this disease, have certain high-risk conditions, may be exposed to blood or other infectious body fluids, are household contacts or sex partners of hepatitis B positive people, are clients or workers in certain care facilities, or travel to or work in countries with a high rate of hepatitis B should be immunized.  Haemophilus influenzae type b (Hib) vaccine. A previously unvaccinated person with asplenia or sickle cell disease or having a scheduled splenectomy should receive 1 dose of Hib vaccine. Regardless of previous immunization, a recipient of a hematopoietic stem cell transplant should receive a  3-dose series 6-12 months after her successful transplant. Hib vaccine is not recommended for adults with HIV infection. Preventive Services / Frequency Ages 35 to 4 years  Blood pressure check.** / Every 3-5 years.  Lipid and cholesterol check.** / Every 5 years beginning at age 60.  Clinical breast exam.** / Every 3 years for women in their 71s and 10s.  BRCA-related cancer risk assessment.** / For women who have family members with a BRCA-related cancer (breast, ovarian, tubal, or peritoneal cancers).  Pap test.** / Every 2 years from ages 76 through 26. Every 3 years starting at age 61 through age 76 or 93 with a history of 3 consecutive normal Pap tests.  HPV screening.** / Every 3 years from ages 37 through ages 60 to 51 with a history of 3 consecutive normal Pap tests.  Hepatitis C blood test.** / For any individual with known risks for hepatitis C.  Skin self-exam. / Monthly.  Influenza vaccine. / Every year.  Tetanus, diphtheria, and acellular pertussis (Tdap, Td) vaccine.** / Consult your health care provider. Pregnant women should receive 1 dose of Tdap vaccine during each pregnancy. 1 dose of Td every 10 years.  Varicella vaccine.** / Consult your health care provider. Pregnant females who do not have evidence of immunity should receive the first dose after pregnancy.  HPV vaccine. / 3 doses over 6 months, if 93 and younger. The vaccine is not recommended for use in pregnant females. However, pregnancy testing is not needed before receiving a dose.  Measles, mumps, rubella (MMR) vaccine.** / You need at least 1 dose of MMR if you were born in 1957 or later. You may also need a 2nd dose. For females of childbearing age, rubella immunity should be determined. If there is no evidence of immunity, females who are not pregnant should be vaccinated. If there is no evidence of immunity, females who are  pregnant should delay immunization until after pregnancy.  Pneumococcal  13-valent conjugate (PCV13) vaccine.** / Consult your health care provider.  Pneumococcal polysaccharide (PPSV23) vaccine.** / 1 to 2 doses if you smoke cigarettes or if you have certain conditions.  Meningococcal vaccine.** / 1 dose if you are age 68 to 8 years and a Market researcher living in a residence hall, or have one of several medical conditions, you need to get vaccinated against meningococcal disease. You may also need additional booster doses.  Hepatitis A vaccine.** / Consult your health care provider.  Hepatitis B vaccine.** / Consult your health care provider.  Haemophilus influenzae type b (Hib) vaccine.** / Consult your health care provider. Ages 7 to 53 years  Blood pressure check.** / Every year.  Lipid and cholesterol check.** / Every 5 years beginning at age 25 years.  Lung cancer screening. / Every year if you are aged 11-80 years and have a 30-pack-year history of smoking and currently smoke or have quit within the past 15 years. Yearly screening is stopped once you have quit smoking for at least 15 years or develop a health problem that would prevent you from having lung cancer treatment.  Clinical breast exam.** / Every year after age 48 years.  BRCA-related cancer risk assessment.** / For women who have family members with a BRCA-related cancer (breast, ovarian, tubal, or peritoneal cancers).  Mammogram.** / Every year beginning at age 41 years and continuing for as long as you are in good health. Consult with your health care provider.  Pap test.** / Every 3 years starting at age 65 years through age 37 or 70 years with a history of 3 consecutive normal Pap tests.  HPV screening.** / Every 3 years from ages 72 years through ages 60 to 40 years with a history of 3 consecutive normal Pap tests.  Fecal occult blood test (FOBT) of stool. / Every year beginning at age 21 years and continuing until age 5 years. You may not need to do this test if you get  a colonoscopy every 10 years.  Flexible sigmoidoscopy or colonoscopy.** / Every 5 years for a flexible sigmoidoscopy or every 10 years for a colonoscopy beginning at age 35 years and continuing until age 48 years.  Hepatitis C blood test.** / For all people born from [birth year] through 1965 and any individual with known risks for hepatitis C.  Skin self-exam. / Monthly.  Influenza vaccine. / Every year.  Tetanus, diphtheria, and acellular pertussis (Tdap/Td) vaccine.** / Consult your health care provider. Pregnant women should receive 1 dose of Tdap vaccine during each pregnancy. 1 dose of Td every 10 years.  Varicella vaccine.** / Consult your health care provider. Pregnant females who do not have evidence of immunity should receive the first dose after pregnancy.  Zoster vaccine.** / 1 dose for adults aged 30 years or older.  Measles, mumps, rubella (MMR) vaccine.** / You need at least 1 dose of MMR if you were born in 1957 or later. You may also need a second dose. For females of childbearing age, rubella immunity should be determined. If there is no evidence of immunity, females who are not pregnant should be vaccinated. If there is no evidence of immunity, females who are pregnant should delay immunization until after pregnancy.  Pneumococcal 13-valent conjugate (PCV13) vaccine.** / Consult your health care provider.  Pneumococcal polysaccharide (PPSV23) vaccine.** / 1 to 2 doses if you smoke cigarettes or if you have certain conditions.  Meningococcal vaccine.** /  Consult your health care provider.  Hepatitis A vaccine.** / Consult your health care provider.  Hepatitis B vaccine.** / Consult your health care provider.  Haemophilus influenzae type b (Hib) vaccine.** / Consult your health care provider. Ages 64 years and over  Blood pressure check.** / Every year.  Lipid and cholesterol check.** / Every 5 years beginning at age 23 years.  Lung cancer screening. / Every year if you  are aged 16-80 years and have a 30-pack-year history of smoking and currently smoke or have quit within the past 15 years. Yearly screening is stopped once you have quit smoking for at least 15 years or develop a health problem that would prevent you from having lung cancer treatment.  Clinical breast exam.** / Every year after age 74 years.  BRCA-related cancer risk assessment.** / For women who have family members with a BRCA-related cancer (breast, ovarian, tubal, or peritoneal cancers).  Mammogram.** / Every year beginning at age 44 years and continuing for as long as you are in good health. Consult with your health care provider.  Pap test.** / Every 3 years starting at age 58 years through age 22 or 39 years with 3 consecutive normal Pap tests. Testing can be stopped between 65 and 70 years with 3 consecutive normal Pap tests and no abnormal Pap or HPV tests in the past 10 years.  HPV screening.** / Every 3 years from ages 64 years through ages 70 or 61 years with a history of 3 consecutive normal Pap tests. Testing can be stopped between 65 and 70 years with 3 consecutive normal Pap tests and no abnormal Pap or HPV tests in the past 10 years.  Fecal occult blood test (FOBT) of stool. / Every year beginning at age 40 years and continuing until age 27 years. You may not need to do this test if you get a colonoscopy every 10 years.  Flexible sigmoidoscopy or colonoscopy.** / Every 5 years for a flexible sigmoidoscopy or every 10 years for a colonoscopy beginning at age 7 years and continuing until age 32 years.  Hepatitis C blood test.** / For all people born from [birth year] through 1967 and any individual with known risks for hepatitis C.  Osteoporosis screening.** / A one-time screening for women ages 30 years and over and women at risk for fractures or osteoporosis.  Skin self-exam. / Monthly.  Influenza vaccine. / Every year.  Tetanus, diphtheria, and acellular pertussis (Tdap/Td)  vaccine.** / 1 dose of Td every 10 years.  Varicella vaccine.** / Consult your health care provider.  Zoster vaccine.** / 1 dose for adults aged 35 years or older.  Pneumococcal 13-valent conjugate (PCV13) vaccine.** / Consult your health care provider.  Pneumococcal polysaccharide (PPSV23) vaccine.** / 1 dose for all adults aged 46 years and older.  Meningococcal vaccine.** / Consult your health care provider.  Hepatitis A vaccine.** / Consult your health care provider.  Hepatitis B vaccine.** / Consult your health care provider.  Haemophilus influenzae type b (Hib) vaccine.** / Consult your health care provider. ** Family history and personal history of risk and conditions may change your health care provider's recommendations.   This information is not intended to replace advice given to you by your health care provider. Make sure you discuss any questions you have with your health care provider.   Document Released: 02/27/2001 Document Revised: 01/22/2014 Document Reviewed: 05/29/2010 Elsevier Interactive Patient Education [employer].

## 2015-09-09 NOTE — Progress Notes (Signed)
Pre visit review using our clinic review tool, if applicable. No additional management support is needed unless otherwise documented below in the visit note. 

## 2015-09-09 NOTE — Progress Notes (Signed)
Subjective:     Jennifer Small is a 60 y.o. female and is here for a comprehensive physical exam. The patient reports no new problems-- -pt has been seeing neuro for progressive alz dementia.  Social History   Social History  . Marital status: Married    Spouse name: [name]  . Number of children: 2  . Years of education: college   Occupational History  .      disability  .  [employer]   Social History Main Topics  . Smoking status: Never Smoker  . Smokeless tobacco: Never Used  . Alcohol use 0.5 oz/week    1 drink(s) per week     Comment: rare  . Drug use: No  . Sexual activity: Yes    Partners: Male   Other Topics Concern  . Not on file   Social History Narrative   GETS REG EXERCISE--walking   Patient lives at home with her husband [name].    Patient is on disability    Patient has college education.   Right handed.   Caffeine.- One or two cups daily.            Health Maintenance  Topic Date Due  . COLONOSCOPY  01/15/2014  . PAP SMEAR  06/27/2015  . INFLUENZA VACCINE  08/16/2015  . MAMMOGRAM  11/10/2016  . TETANUS/TDAP  08/18/2024  . ZOSTAVAX  Completed  . Hepatitis C Screening  Completed  . HIV Screening  Completed    The following portions of the patient's history were reviewed and updated as appropriate:  She  has a past medical history of Dementia; Hypothyroidism; Memory loss; Osteoporosis; and Thyroid disease. She  does not have any pertinent problems on file. She  has a past surgical history that includes Cesarean section and Breast lumpectomy. Her family history includes Arthritis in her father; Down syndrome in her brother. She  reports that she has never smoked. She has never used smokeless tobacco. She reports that she drinks about 0.5 oz of alcohol per week . She reports that she does not use drugs. She has a current medication list which includes the following prescription(s): aspirin, atorvastatin, donepezil, fenofibrate,  memantine, multivitamin, raloxifene, and synthroid. Current Outpatient Prescriptions on File Prior to Visit  Medication Sig Dispense Refill  . aspirin 81 MG tablet Take 81 mg by mouth daily.    Marland Kitchen. donepezil (ARICEPT) 10 MG tablet Take 1 tablet (10 mg total) by mouth daily. 30 tablet 11  . memantine (NAMENDA) 10 MG tablet Take 1 tablet (10 mg total) by mouth 2 (two) times daily. 60 tablet 11  . Multiple Vitamin (MULTIVITAMIN) tablet Take 1 tablet by mouth daily.       No current facility-administered medications on file prior to visit.    She has No Known Allergies..  Review of Systems Review of Systems  Constitutional: Negative for activity change, appetite change and fatigue.  HENT: Negative for hearing loss, congestion, tinnitus and ear discharge.  dentist [id number] Eyes: Negative for visual disturbance (see optho q1y -- vision corrected to 20/20 with glasses).  Respiratory: Negative for cough, chest tightness and shortness of breath.   Cardiovascular: Negative for chest pain, palpitations and leg swelling.  Gastrointestinal: Negative for abdominal pain, diarrhea, constipation and abdominal distention.  Genitourinary: Negative for urgency, frequency, decreased urine volume and difficulty urinating.  Musculoskeletal: Negative for back pain, arthralgias and gait problem.  Skin: Negative for color change, pallor and rash.  Neurological: Negative for dizziness, light-headedness, numbness  and headaches.  Hematological: Negative for adenopathy. Does not bruise/bleed easily.  Psychiatric/Behavioral: Negative for suicidal ideas, confusion, sleep disturbance, self-injury, dysphoric mood, decreased concentration and agitation.       Objective:    BP 114/70 (BP Location: Left Arm, Patient Position: Sitting, Cuff Size: Normal)   Pulse 71   Temp 97.9 F (36.6 C) (Oral)   Ht 5\' 2"  (1.575 m)   Wt 153 lb 12.8 oz (69.8 kg)   SpO2 97%   BMI 28.13 kg/m  General appearance: alert, cooperative,  appears stated age and no distress Head: Normocephalic, without obvious abnormality, atraumatic Eyes: conjunctivae/corneas clear. PERRL, EOM's intact. Fundi benign. Ears: normal TM's and external ear canals both ears Nose: Nares normal. Septum midline. Mucosa normal. No drainage or sinus tenderness. Throat: lips, mucosa, and tongue normal; teeth and gums normal Neck: no adenopathy, no carotid bruit, no JVD, supple, symmetrical, trachea midline and thyroid not enlarged, symmetric, no tenderness/mass/nodules Back: symmetric, no curvature. ROM normal. No CVA tenderness. Lungs: clear to auscultation bilaterally Breasts: gyn Heart: regular rate and rhythm, S1, S2 normal, no murmur, click, rub or gallop Abdomen: soft, non-tender; bowel sounds normal; no masses,  no organomegaly Pelvic: deferred--gyn Extremities: extremities normal, atraumatic, no cyanosis or edema Pulses: 2+ and symmetric Skin: Skin color, texture, turgor normal. No rashes or lesions Lymph nodes: Cervical, supraclavicular, and axillary nodes normal. Neurologic: Alert and oriented X 3, normal strength and tone. Normal symmetric reflexes. Normal coordination and gait    Assessment:    Healthy female exam.      Plan:    ghm utd Check labs See After Visit Summary for Counseling Recommendations    1. Hypothyroidism, unspecified hypothyroidism type Check labs - SYNTHROID 112 MCG tablet; TAKE 1 TABLET BY MOUTH EVERY MORNING BEFORE BREAKFAST  Dispense: 30 tablet; Refill: 11 - TSH - Lipid panel - CBC with Differential/Platelet - Comprehensive metabolic panel  2. High triglycerides  - fenofibrate 160 MG tablet; TAKE 1 TABLET(160 MG) BY MOUTH DAILY  Dispense: 90 tablet; Refill: 1 - Lipid panel - CBC with Differential/Platelet - Comprehensive metabolic panel  3. Hyperlipidemia Check labs  - atorvastatin (LIPITOR) 20 MG tablet; TAKE 1 TABLET(20 MG) BY MOUTH AT BEDTIME  Dispense: 90 tablet; Refill: 1 - Lipid panel - CBC  with Differential/Platelet - Comprehensive metabolic panel  4. Osteopenia  - raloxifene (EVISTA) 60 MG tablet; TAKE 1 TABLET(60 MG) BY MOUTH DAILY  Dispense: 90 tablet; Refill: 1 - Lipid panel - CBC with Differential/Platelet - Comprehensive metabolic panel  5. Preventative health care See above - SYNTHROID 112 MCG tablet; TAKE 1 TABLET BY MOUTH EVERY MORNING BEFORE BREAKFAST  Dispense: 30 tablet; Refill: 11 - raloxifene (EVISTA) 60 MG tablet; TAKE 1 TABLET(60 MG) BY MOUTH DAILY  Dispense: 90 tablet; Refill: 1 - fenofibrate 160 MG tablet; TAKE 1 TABLET(160 MG) BY MOUTH DAILY  Dispense: 90 tablet; Refill: 1 - atorvastatin (LIPITOR) 20 MG tablet; TAKE 1 TABLET(20 MG) BY MOUTH AT BEDTIME  Dispense: 90 tablet; Refill: 1 - TSH - Lipid panel - CBC with Differential/Platelet - Comprehensive metabolic panel

## 2015-10-12 ENCOUNTER — Other Ambulatory Visit: Payer: Self-pay | Admitting: Family Medicine

## 2015-12-07 ENCOUNTER — Other Ambulatory Visit: Payer: Self-pay | Admitting: Family Medicine

## 2015-12-07 DIAGNOSIS — E781 Pure hyperglyceridemia: Secondary | ICD-10-CM

## 2016-02-16 ENCOUNTER — Ambulatory Visit (INDEPENDENT_AMBULATORY_CARE_PROVIDER_SITE_OTHER): Payer: BC Managed Care – PPO | Admitting: Family Medicine

## 2016-02-16 ENCOUNTER — Encounter: Payer: Self-pay | Admitting: Family Medicine

## 2016-02-16 VITALS — BP 109/59 | HR 68 | Temp 98.1°F | Ht 62.0 in | Wt 159.6 lb

## 2016-02-16 DIAGNOSIS — H1032 Unspecified acute conjunctivitis, left eye: Secondary | ICD-10-CM

## 2016-02-16 DIAGNOSIS — Z23 Encounter for immunization: Secondary | ICD-10-CM

## 2016-02-16 MED ORDER — POLYMYXIN B-TRIMETHOPRIM 10000-0.1 UNIT/ML-% OP SOLN: 1.0000 [drp] | OPHTHALMIC | 0 refills | Status: DC

## 2016-02-16 NOTE — Patient Instructions (Addendum)

## 2016-02-16 NOTE — Addendum Note (Signed)
Addended by: Verdie ShireBAYNES, ANGELA M on: 02/16/2016 01:13 PM   Modules accepted: Orders

## 2016-02-16 NOTE — Progress Notes (Signed)
Chief Complaint  Patient presents with  . Eye Problem    (L) red itching-noticed on yest    [name] is here for left eye irritation. Her husband accompanies her and provides a majority of the hx due to her dementia.  Duration: 1 days  Did have some discharge Chemical exposure? No  Recent URI? No  Contact lenses? No  History of allergies? No  Treatment to date: eye drops from grandchild's pink eye- this seems to have helped  ROS:  Eyes: As noted above  Past Medical History:  Diagnosis Date  . Dementia   . Hypothyroidism   . Memory loss   . Osteoporosis   . Thyroid disease    Hypothyroidism   Family History  Problem Relation Age of Onset  . Arthritis Father   . Down syndrome Brother     BP (!) 109/59 (BP Location: Left Arm, Patient Position: Sitting, Cuff Size: Normal)   Pulse 68   Temp 98.1 F (36.7 C) (Oral)   Ht 5\' 2"  (1.575 m)   Wt 159 lb 9.6 oz (72.4 kg)   SpO2 97%   BMI 29.19 kg/m  Gen: Awake, alert, appears stated age Eyes: Lids neg, Sclera injected on L, neg on R, PERRLA, EOMi, no discharge Nose: Nares patent without discharge Psych: Age appropriate judgment and insight; mood and affect normal  Acute bacterial conjunctivitis of left eye - Plan: trimethoprim-polymyxin b (POLYTRIM) ophthalmic solution  Orders as above. Instructed to practice good hand hygiene and try not to touch face. Warm compresses and artificial tears also recommended. F/u if no improvement in 7-10 days. Pt voiced understanding and agreement to the plan.  Jilda Rocheicholas Paul AdaWendling, DO 02/16/16 10:24 AM

## 2016-03-12 ENCOUNTER — Ambulatory Visit (INDEPENDENT_AMBULATORY_CARE_PROVIDER_SITE_OTHER): Payer: BC Managed Care – PPO | Admitting: Family Medicine

## 2016-03-12 ENCOUNTER — Encounter: Payer: Self-pay | Admitting: Family Medicine

## 2016-03-12 VITALS — BP 118/80 | HR 67 | Temp 98.1°F | Resp 16 | Ht 62.0 in | Wt 156.6 lb

## 2016-03-12 DIAGNOSIS — I10 Essential (primary) hypertension: Secondary | ICD-10-CM | POA: Insufficient documentation

## 2016-03-12 DIAGNOSIS — E039 Hypothyroidism, unspecified: Secondary | ICD-10-CM | POA: Diagnosis not present

## 2016-03-12 DIAGNOSIS — Z0000 Encounter for general adult medical examination without abnormal findings: Secondary | ICD-10-CM | POA: Diagnosis not present

## 2016-03-12 DIAGNOSIS — E781 Pure hyperglyceridemia: Secondary | ICD-10-CM

## 2016-03-12 DIAGNOSIS — E785 Hyperlipidemia, unspecified: Secondary | ICD-10-CM | POA: Diagnosis not present

## 2016-03-12 LAB — COMPREHENSIVE METABOLIC PANEL
ALT: 22 U/L (ref 0–35)
AST: 24 U/L (ref 0–37)
Albumin: 4.3 g/dL (ref 3.5–5.2)
Alkaline Phosphatase: 58 U/L (ref 39–117)
BUN: 14 mg/dL (ref 6–23)
CHLORIDE: 107 meq/L (ref 96–112)
CO2: 27 mEq/L (ref 19–32)
CREATININE: 0.92 mg/dL (ref 0.40–1.20)
Calcium: 9.5 mg/dL (ref 8.4–10.5)
GFR: 65.52 mL/min (ref >60.00)
GLUCOSE: 99 mg/dL (ref 70–99)
POTASSIUM: 3.9 meq/L (ref 3.5–5.1)
SODIUM: 143 meq/L (ref 135–145)
Total Bilirubin: 0.5 mg/dL (ref 0.2–1.2)
Total Protein: 6.8 g/dL (ref 6.0–8.3)

## 2016-03-12 LAB — LIPID PANEL
CHOL/HDL RATIO: 3
Cholesterol: 162 mg/dL (ref 0–200)
HDL: 53.8 mg/dL (ref >39.00)
LDL CALC: 83 mg/dL (ref 0–99)
NONHDL: 107.89
Triglycerides: 122 mg/dL (ref 0.0–149.0)
VLDL: 24.4 mg/dL (ref 0.0–40.0)

## 2016-03-12 LAB — TSH: TSH: 3.46 u[IU]/mL (ref 0.35–4.50)

## 2016-03-12 MED ORDER — FENOFIBRATE 160 MG PO TABS: ORAL_TABLET | ORAL | 1 refills | Status: DC

## 2016-03-12 MED ORDER — ATORVASTATIN CALCIUM 20 MG PO TABS: ORAL_TABLET | ORAL | 1 refills | Status: DC

## 2016-03-12 MED ORDER — SYNTHROID 112 MCG PO TABS: ORAL_TABLET | ORAL | 3 refills | Status: DC

## 2016-03-12 NOTE — Progress Notes (Signed)
Pre visit review using our clinic review tool, if applicable. No additional management support is needed unless otherwise documented below in the visit note. 

## 2016-03-12 NOTE — Patient Instructions (Signed)
Hypertension Hypertension, commonly called high blood pressure, is when the force of blood pumping through your arteries is too strong. Your arteries are the blood vessels that carry blood from your heart throughout your body. A blood pressure reading consists of a higher number over a lower number, such as 110/72. The higher number (systolic) is the pressure inside your arteries when your heart pumps. The lower number (diastolic) is the pressure inside your arteries when your heart relaxes. Ideally you want your blood pressure below 120/80. Hypertension forces your heart to work harder to pump blood. Your arteries may become narrow or stiff. Having untreated or uncontrolled hypertension can cause heart attack, stroke, kidney disease, and other problems. What increases the risk? Some risk factors for high blood pressure are controllable. Others are not. Risk factors you cannot control include:  Race. You may be at higher risk if you are African American.  Age. Risk increases with age.  Gender. Men are at higher risk than women before age 45 years. After age 65, women are at higher risk than men. Risk factors you can control include:  Not getting enough exercise or physical activity.  Being overweight.  Getting too much fat, sugar, calories, or salt in your diet.  Drinking too much alcohol. What are the signs or symptoms? Hypertension does not usually cause signs or symptoms. Extremely high blood pressure (hypertensive crisis) may cause headache, anxiety, shortness of breath, and nosebleed. How is this diagnosed? To check if you have hypertension, your health care provider will measure your blood pressure while you are seated, with your arm held at the level of your heart. It should be measured at least twice using the same arm. Certain conditions can cause a difference in blood pressure between your right and left arms. A blood pressure reading that is higher than normal on one occasion does  not mean that you need treatment. If it is not clear whether you have high blood pressure, you may be asked to return on a different day to have your blood pressure checked again. Or, you may be asked to monitor your blood pressure at home for 1 or more weeks. How is this treated? Treating high blood pressure includes making lifestyle changes and possibly taking medicine. Living a healthy lifestyle can help lower high blood pressure. You may need to change some of your habits. Lifestyle changes may include:  Following the DASH diet. This diet is high in fruits, vegetables, and whole grains. It is low in salt, red meat, and added sugars.  Keep your sodium intake below 2,300 mg per day.  Getting at least 30-45 minutes of aerobic exercise at least 4 times per week.  Losing weight if necessary.  Not smoking.  Limiting alcoholic beverages.  Learning ways to reduce stress. Your health care provider may prescribe medicine if lifestyle changes are not enough to get your blood pressure under control, and if one of the following is true:  You are 18-59 years of age and your systolic blood pressure is above 140.  You are 60 years of age or older, and your systolic blood pressure is above 150.  Your diastolic blood pressure is above 90.  You have diabetes, and your systolic blood pressure is over 140 or your diastolic blood pressure is over 90.  You have kidney disease and your blood pressure is above 140/90.  You have heart disease and your blood pressure is above 140/90. Your personal target blood pressure may vary depending on your medical   conditions, your age, and other factors. Follow these instructions at home:  Have your blood pressure rechecked as directed by your health care provider.  Take medicines only as directed by your health care provider. Follow the directions carefully. Blood pressure medicines must be taken as prescribed. The medicine does not work as well when you skip  doses. Skipping doses also puts you at risk for problems.  Do not smoke.  Monitor your blood pressure at home as directed by your health care provider. Contact a health care provider if:  You think you are having a reaction to medicines taken.  You have recurrent headaches or feel dizzy.  You have swelling in your ankles.  You have trouble with your vision. Get help right away if:  You develop a severe headache or confusion.  You have unusual weakness, numbness, or feel faint.  You have severe chest or abdominal pain.  You vomit repeatedly.  You have trouble breathing. This information is not intended to replace advice given to you by your health care provider. Make sure you discuss any questions you have with your health care provider. Document Released: 01/01/2005 Document Revised: 06/09/2015 Document Reviewed: 10/24/2012 Elsevier Interactive Patient Education  2017 [employer]  

## 2016-03-12 NOTE — Assessment & Plan Note (Signed)
Check labs con't meds 

## 2016-03-12 NOTE — Assessment & Plan Note (Signed)
Stable con't meds 

## 2016-03-12 NOTE — Progress Notes (Deleted)
Subjective:    Patient ID: Jennifer ShayMary Small, female    DOB: 1955/12/13, 61 y.o.   MRN: [id number]   I acted as a Neurosurgeonscribe for Dr. Delman KittenLowne Kyliegh Jester, LPN   Chief Complaint  Patient presents with  . Hypertension    follow up  . Hyperlipidemia    follow up  . Hypothyroidism    follow up    Hypertension  This is a chronic problem. The current episode started more than 1 year ago. The problem is controlled. Pertinent negatives include no blurred vision, chest pain, headaches or palpitations.  Hyperlipidemia  This is a chronic problem. The current episode started more than 1 year ago. The problem is controlled. Pertinent negatives include no chest pain.    Patient is in today for follow up hypothyroidism, hypertension, hyperlipidemia.   Husband is present -- they are asking about her colonoscopy.  Her dad had a hx of polyps-- they are hers was due 2 years ago but with her worsening dementia he was hesitant but I did give them the option of cologuard as well.  They want to do something.  They decided to go ahead with the colonoscopy.     Past Medical History:  Diagnosis Date  . Dementia   . Hypothyroidism   . Memory loss   . Osteoporosis   . Thyroid disease    Hypothyroidism    Past Surgical History:  Procedure Laterality Date  . BREAST LUMPECTOMY    . CESAREAN SECTION      Family History  Problem Relation Age of Onset  . Arthritis Father   . Down syndrome Brother     Social History   Social History  . Marital status: Married    Spouse name: [name]  . Number of children: 2  . Years of education: college   Occupational History  .      disability  .  [employer]   Social History Main Topics  . Smoking status: Never Smoker  . Smokeless tobacco: Never Used  . Alcohol use 0.5 oz/week    1 drink(s) per week     Comment: rare  . Drug use: No  . Sexual activity: Yes    Partners: Male   Other Topics Concern  . Not on file   Social History Narrative   GETS  REG EXERCISE--walking   Patient lives at home with her husband [name].    Patient is on disability    Patient has college education.   Right handed.   Caffeine.- One or two cups daily.             Outpatient Medications Prior to Visit  Medication Sig Dispense Refill  . aspirin 81 MG tablet Take 81 mg by mouth daily.    Marland Kitchen. donepezil (ARICEPT) 10 MG tablet Take 1 tablet (10 mg total) by mouth daily. 30 tablet 11  . memantine (NAMENDA) 10 MG tablet Take 1 tablet (10 mg total) by mouth 2 (two) times daily. 60 tablet 11  . Multiple Vitamin (MULTIVITAMIN) tablet Take 1 tablet by mouth daily.      . raloxifene (EVISTA) 60 MG tablet TAKE 1 TABLET(60 MG) BY MOUTH DAILY 90 tablet 1  . atorvastatin (LIPITOR) 20 MG tablet TAKE 1 TABLET(20 MG) BY MOUTH AT BEDTIME 90 tablet 1  . fenofibrate 160 MG tablet TAKE 1 TABLET(160 MG) BY MOUTH DAILY 90 tablet 1  . SYNTHROID 112 MCG tablet TAKE 1 TABLET BY MOUTH EVERY MORNING BEFORE BREAKFAST 30  tablet 11  . trimethoprim-polymyxin b (POLYTRIM) ophthalmic solution Place 1 drop into the left eye every 4 (four) hours. (Patient not taking: Reported on 03/12/2016) 10 mL 0   No facility-administered medications prior to visit.     No Known Allergies  Review of Systems  Constitutional: Negative for fever.  HENT: Negative for congestion.   Eyes: Negative for blurred vision.  Respiratory: Negative for cough.   Cardiovascular: Negative for chest pain and palpitations.  Gastrointestinal: Negative for vomiting.  Musculoskeletal: Negative for back pain.  Skin: Negative for rash.  Neurological: Negative for loss of consciousness and headaches.       Objective:    Physical Exam  Constitutional: She is oriented to person, place, and time. She appears well-developed and well-nourished. No distress.  HENT:  Head: Normocephalic and atraumatic.  Eyes: Conjunctivae are normal. Pupils are equal, round, and reactive to light.  Neck: Normal range of motion. No  thyromegaly present.  Cardiovascular: Normal rate and regular rhythm.   Pulmonary/Chest: Effort normal and breath sounds normal. She has no wheezes.  Abdominal: Soft. Bowel sounds are normal. There is no tenderness.  Musculoskeletal: Normal range of motion. She exhibits no edema or deformity.  Neurological: She is alert and oriented to person, place, and time.  Skin: Skin is warm and dry. She is not diaphoretic.  Psychiatric: She has a normal mood and affect. Her behavior is normal. Judgment and thought content normal.  Nursing note and vitals reviewed.   BP 118/80 (BP Location: Left Arm, Patient Position: Sitting, Cuff Size: Normal)   Pulse 67   Temp 98.1 F (36.7 C) (Oral)   Resp 16   Ht 5\' 2"  (1.575 m)   Wt 156 lb 9.6 oz (71 kg)   SpO2 96%   BMI 28.64 kg/m  Wt Readings from Last 3 Encounters:  03/12/16 156 lb 9.6 oz (71 kg)  02/16/16 159 lb 9.6 oz (72.4 kg)  09/09/15 153 lb 12.8 oz (69.8 kg)     Lab Results  Component Value Date   WBC 8.5 09/09/2015   HGB 15.3 (H) 09/09/2015   HCT 44.5 09/09/2015   PLT 310.0 09/09/2015   GLUCOSE 109 (H) 09/09/2015   CHOL 191 09/09/2015   TRIG 145.0 09/09/2015   HDL 61.40 09/09/2015   LDLDIRECT 108.8 06/26/2012   LDLCALC 101 (H) 09/09/2015   ALT 29 09/09/2015   AST 30 09/09/2015   NA 142 09/09/2015   K 4.0 09/09/2015   CL 106 09/09/2015   CREATININE 0.98 09/09/2015   BUN 16 09/09/2015   CO2 28 09/09/2015   TSH 2.09 09/09/2015   HGBA1C 5.6 08/19/2014   MICROALBUR 1.7 06/26/2012    Lab Results  Component Value Date   TSH 2.09 09/09/2015   Lab Results  Component Value Date   WBC 8.5 09/09/2015   HGB 15.3 (H) 09/09/2015   HCT 44.5 09/09/2015   MCV 92.3 09/09/2015   PLT 310.0 09/09/2015   Lab Results  Component Value Date   NA 142 09/09/2015   K 4.0 09/09/2015   CO2 28 09/09/2015   GLUCOSE 109 (H) 09/09/2015   BUN 16 09/09/2015   CREATININE 0.98 09/09/2015   BILITOT 0.4 09/09/2015   ALKPHOS 55 09/09/2015   AST  30 09/09/2015   ALT 29 09/09/2015   PROT 7.3 09/09/2015   ALBUMIN 4.5 09/09/2015   CALCIUM 9.6 09/09/2015   GFR 61.02 09/09/2015   Lab Results  Component Value Date   CHOL 191 09/09/2015  Lab Results  Component Value Date   HDL 61.40 09/09/2015   Lab Results  Component Value Date   LDLCALC 101 (H) 09/09/2015   Lab Results  Component Value Date   TRIG 145.0 09/09/2015   Lab Results  Component Value Date   CHOLHDL 3 09/09/2015   Lab Results  Component Value Date   HGBA1C 5.6 08/19/2014       Assessment & Plan:   Problem List Items Addressed This Visit      Unprioritized   Essential hypertension    Stable con't meds       Relevant Medications   atorvastatin (LIPITOR) 20 MG tablet   fenofibrate 160 MG tablet   Other Relevant Orders   Comprehensive metabolic panel   Lipid panel   Hyperlipidemia    Check labs con't meds      Relevant Medications   atorvastatin (LIPITOR) 20 MG tablet   fenofibrate 160 MG tablet   Other Relevant Orders   Comprehensive metabolic panel   Lipid panel   Hypothyroidism - Primary    Check labs      Relevant Medications   SYNTHROID 112 MCG tablet   Other Relevant Orders   TSH    Other Visit Diagnoses    Preventative health care       Relevant Medications   atorvastatin (LIPITOR) 20 MG tablet   fenofibrate 160 MG tablet   SYNTHROID 112 MCG tablet   Other Relevant Orders   Ambulatory referral to Gastroenterology   High triglycerides       Relevant Medications   atorvastatin (LIPITOR) 20 MG tablet   fenofibrate 160 MG tablet      I have discontinued Ms. Sellen's trimethoprim-polymyxin b. I am also having her maintain her multivitamin, aspirin, donepezil, memantine, raloxifene, atorvastatin, fenofibrate, and SYNTHROID.  Meds ordered this encounter  Medications  . atorvastatin (LIPITOR) 20 MG tablet    Sig: TAKE 1 TABLET(20 MG) BY MOUTH AT BEDTIME    Dispense:  90 tablet    Refill:  1  . fenofibrate 160 MG  tablet    Sig: TAKE 1 TABLET(160 MG) BY MOUTH DAILY    Dispense:  90 tablet    Refill:  1  . SYNTHROID 112 MCG tablet    Sig: TAKE 1 TABLET BY MOUTH EVERY MORNING BEFORE BREAKFAST    Dispense:  90 tablet    Refill:  3    {PROVIDER TO DELETE} Donato Schultz, DOPatient ID: Jennifer Small, female   DOB: 11-15-1955, 61 y.o.   MRN: [id number]

## 2016-03-12 NOTE — Assessment & Plan Note (Signed)
Check labs 

## 2016-03-30 ENCOUNTER — Encounter: Payer: Self-pay | Admitting: Family Medicine

## 2016-04-22 ENCOUNTER — Other Ambulatory Visit: Payer: Self-pay | Admitting: Family Medicine

## 2016-04-22 DIAGNOSIS — M8580 Other specified disorders of bone density and structure, unspecified site: Secondary | ICD-10-CM

## 2016-04-22 DIAGNOSIS — Z0000 Encounter for general adult medical examination without abnormal findings: Secondary | ICD-10-CM

## 2016-08-06 ENCOUNTER — Ambulatory Visit (INDEPENDENT_AMBULATORY_CARE_PROVIDER_SITE_OTHER): Payer: BC Managed Care – PPO | Admitting: Neurology

## 2016-08-06 ENCOUNTER — Encounter: Payer: Self-pay | Admitting: Neurology

## 2016-08-06 VITALS — BP 142/98 | HR 67

## 2016-08-06 DIAGNOSIS — R413 Other amnesia: Secondary | ICD-10-CM | POA: Diagnosis not present

## 2016-08-06 MED ORDER — QUETIAPINE FUMARATE 25 MG PO TABS: 25.0000 mg | ORAL_TABLET | Freq: Every evening | ORAL | 6 refills | Status: DC | PRN

## 2016-08-06 MED ORDER — DONEPEZIL HCL 10 MG PO TABS: 10.0000 mg | ORAL_TABLET | Freq: Every day | ORAL | 4 refills | Status: DC

## 2016-08-06 MED ORDER — MEMANTINE HCL 10 MG PO TABS
10.0000 mg | ORAL_TABLET | Freq: Two times a day (BID) | ORAL | 4 refills | Status: DC
Start: 2016-08-06 — End: 2017-09-13

## 2016-08-06 NOTE — Progress Notes (Signed)
[facility]  PATIENT: Jennifer Small DOB: 1955/09/25  Jennifer OF PRESENT ILLNESS:  Jennifer Small is a 61 years old right-handed Caucasian female, accompanied by her husband, referred by her primary care physician Dr. Etter Small for evaluation of memory loss since 2012. She has past medical Jennifer of hypothyroidism, on supplement, hyperlipidemia, she had college degree, majored in Pharmacologist, she is a Photographer for [employer], Since 2012, her husband noticed she has confusion episode, confused day of the week, do not know how to set up a clock, anxious, losing confident easily, getting worse over the past year, Herself denied any significant difficulty, denied difficulty handling her job, but getting very frustrated easily during office visit. She was diagnosed with hypothyroidism in early 2013, I don't know exact pathology, she was started on thyroid supplement, husband reported mild improvement  There was no significant family Jennifer of dementia,  MRI of the brain has demonstrated mild atrophy On follow up visit, she tends to be very agitated, refuse MMSE exam. CSF showed TP 45, Glucose67, VDRL-, ACE0, OCB-, wbc1/rbc 0,  EEG showed frequent spkie-slow burst, increased apperance during photic stimulation. Hyperventilation also induced prolonged long, abnormal generalied slow, spike slow activites. I have started her on Depakote xr, she could not tolerate clonazapam, has excessive drowsy. She was evaluated by Dr Jennifer Small in Early 2013, confirmed global and severe cognitive deterioration marked by impairments in multiple cognitive domains, including orientation, attention, mental processing speed, immediate memory, long term memory, language, visual-spatial oragnization, constructional praxis, and abstract reasoning. 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 Labs showed positive ANA, dsDNA, thyroid globulin antibody,elevated TSH 14.7,  normal thyroid peroxidase, negative or normal CBC, CMP, HIV, Lyme, VitD, RPR, ESR, CPK, Vit B12, CRP. mildly elevated LDL 116, triglyceride 189, normal CBC, vitamin B12 1474,   UPDATE June 2014:[name] started her on a trial of prednisone up to 20 mg 2 tablets for one to 2 weeks in April 2014, without significant improvement, she was also evaluated by her rheumatologist Jennifer Small during that period of time, I do not have the feedback, but per patient, there was no definite connective tissue disease, patient failed to respond to prednisone treatment, it was tapered under the guidance of Jennifer Small in 2-3 weeks.She denies gait difficulty, on disability now, but confused easily, only drive very short distance,   UPDATE Oct 14th 2014:Jennifer Small is about the same, she is functional at home, she cooks some, she is home with dog, take dog for a walk. She has quit driving, she does not want to go to [facility] for second opinion UPDATE July 14th 2015:[name] husband retired, they are able to spend more time together, travels some, maintain the house, garden, she sleeps well, eats well, no vision difficulty no gait difficulty, no incontinence. She is taking Namenda, Aricept, Depakote ER 250 mg 2 tablets at night.  Update August 06 2016: I reviewed and summarized the consultation note from [name] in 2016, confirmed the diagnosis of dementia,  She continue to progress, she tends to more anxious, she tends to be easily frustrated, is taking Aricept 10 mg daily, Namenda 10 mg twice a day.   REVIEW OF SYSTEMS: Full 14 system review of systems performed and notable only for those listed, all others are neg:  Speech difficulty, memory loss  ALLERGIES: No Known Allergies  HOME MEDICATIONS: Outpatient Medications Prior to Visit  Medication Sig Dispense Refill  . aspirin 81 MG tablet Take 81 mg by  mouth daily.    Marland Kitchen atorvastatin (LIPITOR) 20 MG tablet TAKE 1 TABLET(20 MG) BY MOUTH AT BEDTIME 90 tablet 1  . donepezil  (ARICEPT) 10 MG tablet Take 1 tablet (10 mg total) by mouth daily. 30 tablet 11  . fenofibrate 160 MG tablet TAKE 1 TABLET(160 MG) BY MOUTH DAILY 90 tablet 1  . memantine (NAMENDA) 10 MG tablet Take 1 tablet (10 mg total) by mouth 2 (two) times daily. 60 tablet 11  . Multiple Vitamin (MULTIVITAMIN) tablet Take 1 tablet by mouth daily.      . raloxifene (EVISTA) 60 MG tablet TAKE 1 TABLET(60 MG) BY MOUTH DAILY 90 tablet 1  . SYNTHROID 112 MCG tablet TAKE 1 TABLET BY MOUTH EVERY MORNING BEFORE BREAKFAST 90 tablet 3  . raloxifene (EVISTA) 60 MG tablet TAKE 1 TABLET(60 MG) BY MOUTH DAILY 90 tablet 0   No facility-administered medications prior to visit.     PAST MEDICAL Jennifer: Past Medical Jennifer:  Diagnosis Date  . Dementia   . Hypothyroidism   . Memory loss   . Osteoporosis   . Thyroid disease    Hypothyroidism    PAST SURGICAL Jennifer: Past Surgical Jennifer:  Procedure Laterality Date  . BREAST LUMPECTOMY    . CESAREAN SECTION      FAMILY Jennifer: Family Jennifer  Problem Relation Age of Onset  . Arthritis Father   . Down syndrome Brother     SOCIAL Jennifer: Social Jennifer   Social Jennifer  . Marital status: Married    Spouse name: Jennifer Small  . Number of children: 2  . Years of education: college   Occupational Jennifer  .      disability  .  [place] Jennifer Main Topics  . Smoking status: Never Smoker  . Smokeless tobacco: Never Used  . Alcohol use 0.5 oz/week    1 drink(s) per week     Comment: rare  . Drug use: No  . Sexual activity: Yes    Partners: Male   Other Topics Concern  . Not on file   Social Jennifer Narrative   GETS REG EXERCISE--walking   Patient lives at home with her husband Jennifer Small. ).    Patient is on disability    Patient has college education.   Right handed.   Caffeine.- One or two cups daily.              PHYSICAL EXAM  Vitals:   08/06/16 0927  BP: (!) 142/98  Pulse: 67   There is no height or  weight on file to calculate BMI.  PHYSICAL EXAMNIATION:  Gen: NAD, conversant, well nourised, obese, well groomed                     Cardiovascular: Regular rate rhythm, no peripheral edema, warm, nontender. Eyes: Conjunctivae clear without exudates or hemorrhage Neck: Supple, no carotid bruits. Pulmonary: Clear to auscultation bilaterally   NEUROLOGICAL EXAM:  MENTAL STATUS: Speech/Cognition: Agitated, pacing around   CRANIAL NERVES: CN II: Visual fields are full to confrontation. Fundoscopic exam is normal with sharp discs and no vascular changes. Pupils are round equal and briskly reactive to light. CN III, IV, VI: extraocular movement are normal. No ptosis. CN V: Facial sensation is intact to pinprick in all 3 divisions bilaterally. Corneal responses are intact.  CN VII: Face is symmetric with normal eye closure and smile. CN VIII: Hearing is normal to rubbing fingers CN IX, X: Palate elevates symmetrically.  Phonation is normal. CN XI: Head turning and shoulder shrug are intact CN XII: Tongue is midline with normal movements and no atrophy.  MOTOR: There is no pronator drift of out-stretched arms. Muscle bulk and tone are normal. Muscle strength is normal.  REFLEXES: Reflexes are 2+ and symmetric at the biceps, triceps, knees, and ankles. Plantar responses are flexor.  SENSORY: Intact to light touch, pinprick, positional and vibratory sensation are intact in fingers and toes.  COORDINATION: Rapid alternating movements and fine finger movements are intact. There is no dysmetria on finger-to-nose and heel-knee-shin.    GAIT/STANCE: Posture is normal. Gait is steady with normal steps, base, arm swing, and turning. Heel and toe walking are normal. Tandem gait is normal.  Romberg is absent.    DIAGNOSTIC DATA (LABS, IMAGING, TESTING) -  ASSESSMENT AND PLAN Dementia with agitation  Progressive worsening  Continue Aricept 10 mg daily, Namenda 10 mg twice a day  Add on  seroquel 58m prn    YMarcial Pacas M.D. Ph.D. [facility] [address] [place] [phone]

## 2016-09-03 ENCOUNTER — Other Ambulatory Visit: Payer: Self-pay | Admitting: Family Medicine

## 2016-09-03 DIAGNOSIS — Z0000 Encounter for general adult medical examination without abnormal findings: Secondary | ICD-10-CM

## 2016-09-03 DIAGNOSIS — E785 Hyperlipidemia, unspecified: Secondary | ICD-10-CM

## 2016-09-03 DIAGNOSIS — E781 Pure hyperglyceridemia: Secondary | ICD-10-CM

## 2016-09-19 ENCOUNTER — Other Ambulatory Visit: Payer: Self-pay | Admitting: Nurse Practitioner

## 2016-09-19 ENCOUNTER — Other Ambulatory Visit: Payer: Self-pay | Admitting: Family Medicine

## 2016-09-19 DIAGNOSIS — E039 Hypothyroidism, unspecified: Secondary | ICD-10-CM

## 2016-09-19 DIAGNOSIS — Z0000 Encounter for general adult medical examination without abnormal findings: Secondary | ICD-10-CM

## 2016-09-24 ENCOUNTER — Encounter: Payer: Self-pay | Admitting: Family Medicine

## 2016-09-24 ENCOUNTER — Ambulatory Visit (INDEPENDENT_AMBULATORY_CARE_PROVIDER_SITE_OTHER): Payer: BC Managed Care – PPO | Admitting: Family Medicine

## 2016-09-24 VITALS — BP 112/74 | HR 65 | Temp 97.5°F | Ht 62.0 in | Wt 151.8 lb

## 2016-09-24 DIAGNOSIS — E785 Hyperlipidemia, unspecified: Secondary | ICD-10-CM

## 2016-09-24 DIAGNOSIS — G301 Alzheimer's disease with late onset: Secondary | ICD-10-CM

## 2016-09-24 DIAGNOSIS — Z23 Encounter for immunization: Secondary | ICD-10-CM

## 2016-09-24 DIAGNOSIS — I10 Essential (primary) hypertension: Secondary | ICD-10-CM | POA: Diagnosis not present

## 2016-09-24 DIAGNOSIS — F0281 Dementia in other diseases classified elsewhere with behavioral disturbance: Secondary | ICD-10-CM | POA: Diagnosis not present

## 2016-09-24 DIAGNOSIS — E039 Hypothyroidism, unspecified: Secondary | ICD-10-CM

## 2016-09-24 DIAGNOSIS — Z0000 Encounter for general adult medical examination without abnormal findings: Secondary | ICD-10-CM | POA: Diagnosis not present

## 2016-09-24 DIAGNOSIS — M810 Age-related osteoporosis without current pathological fracture: Secondary | ICD-10-CM | POA: Diagnosis not present

## 2016-09-24 DIAGNOSIS — F02818 Alzheimer's disease with late onset: Secondary | ICD-10-CM

## 2016-09-24 LAB — CBC WITH DIFFERENTIAL/PLATELET
BASOS ABS: 0 10*3/uL (ref 0.0–0.1)
Basophils Relative: 0.6 % (ref 0.0–3.0)
EOS PCT: 2.3 % (ref 0.0–5.0)
Eosinophils Absolute: 0.2 10*3/uL (ref 0.0–0.7)
HCT: 44.1 % (ref 36.0–46.0)
HEMOGLOBIN: 14.8 g/dL (ref 12.0–15.0)
LYMPHS ABS: 1.8 10*3/uL (ref 0.7–4.0)
Lymphocytes Relative: 27.8 % (ref 12.0–46.0)
MCHC: 33.5 g/dL (ref 30.0–36.0)
MCV: 94.1 fl (ref 78.0–100.0)
MONO ABS: 0.5 10*3/uL (ref 0.1–1.0)
MONOS PCT: 8 % (ref 3.0–12.0)
NEUTROS PCT: 61.3 % (ref 43.0–77.0)
Neutro Abs: 4.1 10*3/uL (ref 1.4–7.7)
Platelets: 282 10*3/uL (ref 150.0–400.0)
RBC: 4.69 Mil/uL (ref 3.87–5.11)
RDW: 13.3 % (ref 11.5–15.5)
WBC: 6.6 10*3/uL (ref 4.0–10.5)

## 2016-09-24 LAB — COMPREHENSIVE METABOLIC PANEL
ALBUMIN: 4.2 g/dL (ref 3.5–5.2)
ALK PHOS: 55 U/L (ref 39–117)
ALT: 23 U/L (ref 0–35)
AST: 24 U/L (ref 0–37)
BILIRUBIN TOTAL: 0.5 mg/dL (ref 0.2–1.2)
BUN: 14 mg/dL (ref 6–23)
CO2: 27 mEq/L (ref 19–32)
Calcium: 9.7 mg/dL (ref 8.4–10.5)
Chloride: 106 mEq/L (ref 96–112)
Creatinine, Ser: 0.9 mg/dL (ref 0.40–1.20)
GFR: 67.09 mL/min (ref >60.00)
GLUCOSE: 108 mg/dL — AB (ref 70–99)
Potassium: 4.1 mEq/L (ref 3.5–5.1)
SODIUM: 143 meq/L (ref 135–145)
TOTAL PROTEIN: 6.6 g/dL (ref 6.0–8.3)

## 2016-09-24 LAB — LIPID PANEL
CHOL/HDL RATIO: 3
Cholesterol: 167 mg/dL (ref 0–200)
HDL: 48.9 mg/dL (ref >39.00)
LDL Cholesterol: 94 mg/dL (ref 0–99)
NONHDL: 118.15
Triglycerides: 120 mg/dL (ref 0.0–149.0)
VLDL: 24 mg/dL (ref 0.0–40.0)

## 2016-09-24 LAB — TSH: TSH: 1.7 u[IU]/mL (ref 0.35–4.50)

## 2016-09-24 NOTE — Patient Instructions (Signed)
Preventive Care 40-64 Years, Female Preventive care refers to lifestyle choices and visits with your health care provider that can promote health and wellness. What does preventive care include?  A yearly physical exam. This is also called an annual well check.  Dental exams once or twice a year.  Routine eye exams. Ask your health care provider how often you should have your eyes checked.  Personal lifestyle choices, including: ? Daily care of your teeth and gums. ? Regular physical activity. ? Eating a healthy diet. ? Avoiding tobacco and drug use. ? Limiting alcohol use. ? Practicing safe sex. ? Taking low-dose aspirin daily starting at age 58. ? Taking vitamin and mineral supplements as recommended by your health care provider. What happens during an annual well check? The services and screenings done by your health care provider during your annual well check will depend on your age, overall health, lifestyle risk factors, and family history of disease. Counseling Your health care provider may ask you questions about your:  Alcohol use.  Tobacco use.  Drug use.  Emotional well-being.  Home and relationship well-being.  Sexual activity.  Eating habits.  Work and work Statistician.  Method of birth control.  Menstrual cycle.  Pregnancy history.  Screening You may have the following tests or measurements:  Height, weight, and BMI.  Blood pressure.  Lipid and cholesterol levels. These may be checked every 5 years, or more frequently if you are over 81 years old.  Skin check.  Lung cancer screening. You may have this screening every year starting at age 78 if you have a 30-pack-year history of smoking and currently smoke or have quit within the past 15 years.  Fecal occult blood test (FOBT) of the stool. You may have this test every year starting at age 65.  Flexible sigmoidoscopy or colonoscopy. You may have a sigmoidoscopy every 5 years or a colonoscopy  every 10 years starting at age 30.  Hepatitis C blood test.  Hepatitis B blood test.  Sexually transmitted disease (STD) testing.  Diabetes screening. This is done by checking your blood sugar (glucose) after you have not eaten for a while (fasting). You may have this done every 1-3 years.  Mammogram. This may be done every 1-2 years. Talk to your health care provider about when you should start having regular mammograms. This may depend on whether you have a family history of breast cancer.  BRCA-related cancer screening. This may be done if you have a family history of breast, ovarian, tubal, or peritoneal cancers.  Pelvic exam and Pap test. This may be done every 3 years starting at age 80. Starting at age 36, this may be done every 5 years if you have a Pap test in combination with an HPV test.  Bone density scan. This is done to screen for osteoporosis. You may have this scan if you are at high risk for osteoporosis.  Discuss your test results, treatment options, and if necessary, the need for more tests with your health care provider. Vaccines Your health care provider may recommend certain vaccines, such as:  Influenza vaccine. This is recommended every year.  Tetanus, diphtheria, and acellular pertussis (Tdap, Td) vaccine. You may need a Td booster every 10 years.  Varicella vaccine. You may need this if you have not been vaccinated.  Zoster vaccine. You may need this after age 5.  Measles, mumps, and rubella (MMR) vaccine. You may need at least one dose of MMR if you were born in  1957 or later. You may also need a second dose.  Pneumococcal 13-valent conjugate (PCV13) vaccine. You may need this if you have certain conditions and were not previously vaccinated.  Pneumococcal polysaccharide (PPSV23) vaccine. You may need one or two doses if you smoke cigarettes or if you have certain conditions.  Meningococcal vaccine. You may need this if you have certain  conditions.  Hepatitis A vaccine. You may need this if you have certain conditions or if you travel or work in places where you may be exposed to hepatitis A.  Hepatitis B vaccine. You may need this if you have certain conditions or if you travel or work in places where you may be exposed to hepatitis B.  Haemophilus influenzae type b (Hib) vaccine. You may need this if you have certain conditions.  Talk to your health care provider about which screenings and vaccines you need and how often you need them. This information is not intended to replace advice given to you by your health care provider. Make sure you discuss any questions you have with your health care provider. Document Released: 01/28/2015 Document Revised: 09/21/2015 Document Reviewed: 11/02/2014 Elsevier Interactive Patient Education  2017 [employer].

## 2016-09-24 NOTE — Assessment & Plan Note (Signed)
Check labs today.

## 2016-09-24 NOTE — Assessment & Plan Note (Signed)
Per neuro 

## 2016-09-24 NOTE — Progress Notes (Signed)
Subjective:     Jennifer Small is a 61 y.o. female and is here for a comprehensive physical exam. The patient reports no new problems-- dementia is progressing slowly.  husband is with pt. . They prefer not to do mammograms, bone density, colonoscopy or paps any more.    Social History   Social History  . Marital status: Married    Spouse name: Jennifer Small  . Number of children: 2  . Years of education: college   Occupational History  .      disability  .  [employer]   Social History Main Topics  . Smoking status: Never Smoker  . Smokeless tobacco: Never Used  . Alcohol use 0.5 oz/week    1 drink(s) per week     Comment: rare  . Drug use: No  . Sexual activity: Yes    Partners: Male   Other Topics Concern  . Not on file   Social History Narrative   GETS REG EXERCISE--walking   Patient lives at home with her husband Jennifer Grist(Dave. ).    Patient is on disability    Patient has college education.   Right handed.   Caffeine.- One or two cups daily.            Health Maintenance  Topic Date Due  . INFLUENZA VACCINE  08/15/2016  . TETANUS/TDAP  08/18/2024  . Hepatitis C Screening  Completed  . HIV Screening  Completed    The following portions of the patient's history were reviewed and updated as appropriate:  She  has a past medical history of Dementia; Hypothyroidism; Memory loss; Osteoporosis; and Thyroid disease. She  does not have any pertinent problems on file. She  has a past surgical history that includes Cesarean section and Breast lumpectomy. Her family history includes Arthritis in her father; Dementia in her brother; Down syndrome in her brother; Gallbladder disease in her father. She  reports that she has never smoked. She has never used smokeless tobacco. She reports that she drinks about 0.5 oz of alcohol per week . She reports that she does not use drugs. She has a current medication list which includes the following prescription(s): aspirin,  atorvastatin, donepezil, fenofibrate, memantine, multivitamin, quetiapine, raloxifene, and synthroid. Current Outpatient Prescriptions on File Prior to Visit  Medication Sig Dispense Refill  . aspirin 81 MG tablet Take 81 mg by mouth daily.    Marland Kitchen. atorvastatin (LIPITOR) 20 MG tablet TAKE 1 TABLET(20 MG) BY MOUTH AT BEDTIME 90 tablet 0  . donepezil (ARICEPT) 10 MG tablet Take 1 tablet (10 mg total) by mouth daily. 90 tablet 4  . fenofibrate 160 MG tablet TAKE 1 TABLET(160 MG) BY MOUTH DAILY 90 tablet 0  . memantine (NAMENDA) 10 MG tablet Take 1 tablet (10 mg total) by mouth 2 (two) times daily. 180 tablet 4  . Multiple Vitamin (MULTIVITAMIN) tablet Take 1 tablet by mouth daily.      . QUEtiapine (SEROQUEL) 25 MG tablet Take 1 tablet (25 mg total) by mouth at bedtime as needed. 30 tablet 6  . raloxifene (EVISTA) 60 MG tablet TAKE 1 TABLET(60 MG) BY MOUTH DAILY 90 tablet 1  . SYNTHROID 112 MCG tablet TAKE 1 TABLET BY MOUTH EVERY MORNING BEFORE BREAKFAST 90 tablet 3   No current facility-administered medications on file prior to visit.    She has No Known Allergies..  Review of Systems Review of Systems  Constitutional: Negative for activity change, appetite change and fatigue.  HENT: Negative for  hearing loss, congestion, tinnitus and ear discharge.  dentist q14m Eyes: Negative for visual disturbance (see optho q1y -- vision corrected to 20/20 with glasses).  Respiratory: Negative for cough, chest tightness and shortness of breath.   Cardiovascular: Negative for chest pain, palpitations and leg swelling.  Gastrointestinal: Negative for abdominal pain, diarrhea, constipation and abdominal distention.  Genitourinary: Negative for urgency, frequency, decreased urine volume and difficulty urinating.  Musculoskeletal: Negative for back pain, arthralgias and gait problem.  Skin: Negative for color change, pallor and rash.  Neurological: Negative for dizziness, light-headedness, numbness and  headaches. +progressive memory loss Hematological: Negative for adenopathy. Does not bruise/bleed easily.  Psychiatric/Behavioral: Negative for suicidal ideas, + progressing dementia      Objective:    BP 112/74 (BP Location: Right Arm, Patient Position: Sitting, Cuff Size: Normal)   Pulse 65   Temp (!) 97.5 F (36.4 C) (Oral)   Ht  (1.575 m)   Wt 151 lb 12.8 oz (68.9 kg)   SpO2 95%   BMI 27.76 kg/m  General appearance: cooperative, appears stated age, no distress and slowed mentation Head: Normocephalic, without obvious abnormality, atraumatic Eyes: negative findings: lids and lashes normal and pupils equal, round, reactive to light and accomodation Ears: normal TM's and external ear canals both ears Nose: Nares normal. Septum midline. Mucosa normal. No drainage or sinus tenderness. Throat: lips, mucosa, and tongue normal; teeth and gums normal Neck: no adenopathy, no carotid bruit, no JVD, supple, symmetrical, trachea midline and thyroid not enlarged, symmetric, no tenderness/mass/nodules Back: symmetric, no curvature. ROM normal. No CVA tenderness. Lungs: clear to auscultation bilaterally Breasts: normal appearance, no masses or tenderness Heart: regular rate and rhythm, S1, S2 normal, no murmur, click, rub or gallop Abdomen: soft, non-tender; bowel sounds normal; no masses,  no organomegaly Pelvic: not indicated; post-menopausal, no abnormal Pap smears in past Extremities: extremities normal, atraumatic, no cyanosis or edema Pulses: 2+ and symmetric Skin: Skin color, texture, turgor normal. No rashes or lesions Lymph nodes: Cervical, supraclavicular, and axillary nodes normal. Neurologic: Alert and oriented X 3, normal strength and tone. Normal symmetric reflexes. Normal coordination and gait    Assessment:    Healthy female exam.      Plan:    ghm utd Check labs Flu shot given shingrix discussed with husband  See After Visit Summary for Counseling  Recommendations     1. Need for prophylactic vaccination and inoculation against influenza  - Flu Vaccine QUAD 6+ mos PF IM (Fluarix Quad PF)  2. Hypothyroidism, unspecified type con't meds Check labs  - TSH  3. Preventative health care See above - TSH - Lipid panel - CBC with Differential/Platelet - Comprehensive metabolic panel  4. Osteoporosis, unspecified osteoporosis type, unspecified pathological fracture presence Husband prefers not to do bmd anymore  5. Essential hypertension Well controlled, no changes to meds. Encouraged heart healthy diet such as the DASH diet and exercise as tolerated.    6. Hyperlipidemia, unspecified hyperlipidemia type Tolerating statin, encouraged heart healthy diet, avoid trans fats, minimize simple carbs and saturated fats. Increase exercise as tolerated  7. Late onset Alzheimer's disease with behavioral disturbance Per neuro

## 2016-09-24 NOTE — Assessment & Plan Note (Signed)
Check labs today con't meds 

## 2016-09-24 NOTE — Assessment & Plan Note (Signed)
Husband prefer not to put the pt through any more testing

## 2016-09-24 NOTE — Assessment & Plan Note (Signed)
Well controlled, no changes to meds. Encouraged heart healthy diet such as the DASH diet and exercise as tolerated.  °

## 2016-10-18 ENCOUNTER — Other Ambulatory Visit: Payer: Self-pay | Admitting: Family Medicine

## 2016-11-30 ENCOUNTER — Other Ambulatory Visit: Payer: Self-pay | Admitting: Family Medicine

## 2016-11-30 DIAGNOSIS — E785 Hyperlipidemia, unspecified: Secondary | ICD-10-CM

## 2016-11-30 DIAGNOSIS — E781 Pure hyperglyceridemia: Secondary | ICD-10-CM

## 2016-11-30 DIAGNOSIS — Z0000 Encounter for general adult medical examination without abnormal findings: Secondary | ICD-10-CM

## 2017-01-16 ENCOUNTER — Other Ambulatory Visit: Payer: Self-pay | Admitting: Family Medicine

## 2017-01-16 DIAGNOSIS — Z0000 Encounter for general adult medical examination without abnormal findings: Secondary | ICD-10-CM

## 2017-01-16 DIAGNOSIS — M8580 Other specified disorders of bone density and structure, unspecified site: Secondary | ICD-10-CM

## 2017-03-01 ENCOUNTER — Other Ambulatory Visit: Payer: Self-pay | Admitting: Family Medicine

## 2017-03-01 DIAGNOSIS — E785 Hyperlipidemia, unspecified: Secondary | ICD-10-CM

## 2017-03-01 DIAGNOSIS — Z0000 Encounter for general adult medical examination without abnormal findings: Secondary | ICD-10-CM

## 2017-03-01 DIAGNOSIS — E781 Pure hyperglyceridemia: Secondary | ICD-10-CM

## 2017-04-21 ENCOUNTER — Other Ambulatory Visit: Payer: Self-pay | Admitting: Family Medicine

## 2017-04-21 DIAGNOSIS — Z0000 Encounter for general adult medical examination without abnormal findings: Secondary | ICD-10-CM

## 2017-04-21 DIAGNOSIS — E039 Hypothyroidism, unspecified: Secondary | ICD-10-CM

## 2017-05-27 ENCOUNTER — Other Ambulatory Visit: Payer: Self-pay | Admitting: Family Medicine

## 2017-05-27 DIAGNOSIS — E785 Hyperlipidemia, unspecified: Secondary | ICD-10-CM

## 2017-05-27 DIAGNOSIS — Z0000 Encounter for general adult medical examination without abnormal findings: Secondary | ICD-10-CM

## 2017-05-27 DIAGNOSIS — E781 Pure hyperglyceridemia: Secondary | ICD-10-CM

## 2017-08-07 ENCOUNTER — Ambulatory Visit: Payer: Medicare Other | Admitting: Neurology

## 2017-08-07 ENCOUNTER — Encounter: Payer: Self-pay | Admitting: Neurology

## 2017-08-07 VITALS — BP 122/68 | HR 64 | Ht 62.0 in | Wt 146.0 lb

## 2017-08-07 DIAGNOSIS — F0391 Unspecified dementia with behavioral disturbance: Secondary | ICD-10-CM

## 2017-08-07 DIAGNOSIS — F03918 Unspecified dementia, unspecified severity, with other behavioral disturbance: Secondary | ICD-10-CM | POA: Insufficient documentation

## 2017-08-07 MED ORDER — QUETIAPINE FUMARATE 25 MG PO TABS: 25.0000 mg | ORAL_TABLET | Freq: Every day | ORAL | 11 refills | Status: DC

## 2017-08-07 NOTE — Patient Instructions (Signed)
[facility] [address] [place], [place] [address] Office: [phone] FAX: [phone]([phone] [place] [facility]: [phone]  Call: [phone] Or Fill Out Our Form, Here. [facility] Administrative Office [address] [place], [place] [address] Phone: [phone]([phone]  /  Fax: [phone](336) [phone] [facility] Accounting Office [address] [place], [place] [address] Phone: [phone]  /  Fax: [phone]([phone]

## 2017-08-07 NOTE — Progress Notes (Signed)
[facility]  PATIENT: Jennifer Small DOB: 06-Dec-1955  HISTORY OF PRESENT ILLNESS:  HISTORY Jennifer Small is a 62 years old right-handed Caucasian female, accompanied by her husband, referred by her primary care physician Dr. Etter Sjogren for evaluation of memory loss since 2012.   She has past medical history of hypothyroidism, on supplement, hyperlipidemia, she had college degree, majored in Pharmacologist, she is a Photographer for [employer], Since 2012, her husband noticed she has confusion episode, confused day of the week, do not know how to set up a clock, anxious, losing confident easily, getting worse over the past year, Herself denied any significant difficulty, denied difficulty handling her job, but getting very frustrated easily during office visit. She was diagnosed with hypothyroidism in early 2013, I don't know exact pathology, she was started on thyroid supplement, husband reported mild improvement  There was no significant family history of dementia,  MRI of the brain has demonstrated mild atrophy On follow up visit, she tends to be very agitated, refuse MMSE exam. CSF showed TP 45, Glucose67, VDRL-, ACE0, OCB-, wbc1/rbc 0,  EEG showed frequent spkie-slow burst, increased apperance during photic stimulation. Hyperventilation also induced prolonged long, abnormal generalied slow, spike slow activites. I have started her on Depakote xr, she could not tolerate clonazapam, has excessive drowsy. She was evaluated by Dr Valentina Shaggy in Early 2013, confirmed global and severe cognitive deterioration marked by impairments in multiple cognitive domains, including orientation, attention, mental processing speed, immediate   Labs showed positive ANA, dsDNA, thyroid globulin antibody,elevated TSH 14.7, normal thyroid peroxidase, negative or normal CBC, CMP, HIV, Lyme, VitD, RPR, ESR, CPK, Vit B12, CRP. mildly elevated LDL 116, triglyceride 189, normal CBC,  vitamin B12 1474,   UPDATE June 2014: I started her on a trial of prednisone up to 20 mg 2 tablets for one to 2 weeks in April 2014, without significant improvement, she was also evaluated by her rheumatologist Dr. Lenn Sink during that period of time, I do not have the feedback, but per patient, there was no definite connective tissue disease, patient failed to respond to prednisone treatment, it was tapered under the guidance of Dr. Luan Pulling in 2-3 weeks.She denies gait difficulty, on disability now, but confused easily, only drive very short distance,   UPDATE Oct 14th 2014:She is about the same, she is functional at home, she cooks some, she is home with dog, take dog for a walk. She has quit driving, she does not want to go to [facility] for second opinion  UPDATE July 14th 2015:Her husband retired, they are able to spend more time together, travels some, maintain the house, garden, she sleeps well, eats well, no vision difficulty no gait difficulty, no incontinence. She is taking Namenda, Aricept, Depakote ER 250 mg 2 tablets at night.  Update August 06 2016: I reviewed and summarized the consultation note from [facility] in 2016, confirmed the diagnosis of dementia,  She continue to progress, she tends to more anxious, she tends to be easily frustrated, is taking Aricept 10 mg daily, Namenda 10 mg twice a day.   UPDATE August 07 2017: She continued to decline steadily, her husband has retired, spends most of his days taking care of her, she could not carry on a normal conversation, tends to be agitated during the daytime, pacing around.  She has no difficulty sleeping, but has daytime agitation.  REVIEW OF SYSTEMS: Full 14 system review of systems performed and notable only for those listed, all others are  neg:  Memory loss, speech difficulty, agitations, confusion, hyperactive  ALLERGIES: No Known Allergies  HOME MEDICATIONS: Outpatient Medications Prior to Visit  Medication Sig Dispense  Refill  . atorvastatin (LIPITOR) 20 MG tablet TAKE 1 TABLET(20 MG) BY MOUTH AT BEDTIME 90 tablet 0  . donepezil (ARICEPT) 10 MG tablet Take 1 tablet (10 mg total) by mouth daily. 90 tablet 4  . fenofibrate 160 MG tablet TAKE 1 TABLET(160 MG) BY MOUTH DAILY 90 tablet 0  . memantine (NAMENDA) 10 MG tablet Take 1 tablet (10 mg total) by mouth 2 (two) times daily. 180 tablet 4  . Multiple Vitamin (MULTIVITAMIN) tablet Take 1 tablet by mouth daily.      . QUEtiapine (SEROQUEL) 25 MG tablet Take 1 tablet (25 mg total) by mouth at bedtime as needed. 30 tablet 6  . raloxifene (EVISTA) 60 MG tablet TAKE 1 TABLET(60 MG) BY MOUTH DAILY 90 tablet 0  . SYNTHROID 112 MCG tablet TAKE 1 TABLET BY MOUTH EVERY MORNING BEFORE BREAKFAST 90 tablet 3  . aspirin 81 MG tablet Take 81 mg by mouth daily.    . raloxifene (EVISTA) 60 MG tablet TAKE 1 TABLET(60 MG) BY MOUTH DAILY 90 tablet 2  . SYNTHROID 112 MCG tablet TAKE 1 TABLET BY MOUTH EVERY MORNING BEFORE BREAKFAST 90 tablet 1   No facility-administered medications prior to visit.     PAST MEDICAL HISTORY: Past Medical History:  Diagnosis Date  . Dementia   . Hypothyroidism   . Memory loss   . Osteoporosis   . Thyroid disease    Hypothyroidism    PAST SURGICAL HISTORY: Past Surgical History:  Procedure Laterality Date  . BREAST LUMPECTOMY    . CESAREAN SECTION      FAMILY HISTORY: Family History  Problem Relation Age of Onset  . Arthritis Father   . Gallbladder disease Father   . Down syndrome Brother   . Dementia Brother     SOCIAL HISTORY: Social History   Socioeconomic History  . Marital status: Married    Spouse name: [name]  . Number of children: 2  . Years of education: college  . Highest education level: Not on file  Occupational History    Comment: disability    Employer: [employer]  . Financial resource strain: Not on file  . Food insecurity:    Worry: Not on file    Inability: Not on file  .  Transportation needs:    Medical: Not on file    Non-medical: Not on file  Tobacco Use  . Smoking status: Never Smoker  . Smokeless tobacco: Never Used  Substance and Sexual Activity  . Alcohol use: Yes    Alcohol/week: 0.6 oz    Types: 1 drink(s) per week    Comment: rare  . Drug use: No  . Sexual activity: Yes    Partners: Male  Lifestyle  . Physical activity:    Days per week: Not on file    Minutes per session: Not on file  . Stress: Not on file  Relationships  . Social connections:    Talks on phone: Not on file    Gets together: Not on file    Attends religious service: Not on file    Active member of club or organization: Not on file    Attends meetings of clubs or organizations: Not on file    Relationship status: Not on file  . Intimate partner violence:    Fear of current  or ex partner: Not on file    Emotionally abused: Not on file    Physically abused: Not on file    Forced sexual activity: Not on file  Other Topics Concern  . Not on file  Social History Narrative   GETS REG EXERCISE--walking   Patient lives at home with her husband [name]. ).    Patient is on disability    Patient has college education.   Right handed.   Caffeine.- One or two cups daily.              PHYSICAL EXAM  Vitals:   08/07/17 1016  BP: 122/68  Pulse: 64  Weight: 146 lb (66.2 kg)  Height: '5\' 2"'$  (1.575 m)   Body mass index is 26.7 kg/m.  PHYSICAL EXAMNIATION:  Gen: NAD, conversant, well nourised, obese, well groomed                     Cardiovascular: Regular rate rhythm, no peripheral edema, warm, nontender. Eyes: Conjunctivae clear without exudates or hemorrhage Neck: Supple, no carotid bruits. Pulmonary: Clear to auscultation bilaterally   NEUROLOGICAL EXAM:  MENTAL STATUS: Speech/Cognition: Agitated, pacing around, refused to do mental status exam,   CRANIAL NERVES: CN II: Visual fields are full to confrontation. Fundoscopic exam is normal with sharp  discs and no vascular changes. Pupils are round equal and briskly reactive to light. CN III, IV, VI: extraocular movement are normal. No ptosis. CN V: Facial sensation is intact to pinprick in all 3 divisions bilaterally. Corneal responses are intact.  CN VII: Face is symmetric with normal eye closure and smile. CN VIII: Hearing is normal to rubbing fingers CN IX, X: Palate elevates symmetrically. Phonation is normal. CN XI: Head turning and shoulder shrug are intact CN XII: Tongue is midline with normal movements and no atrophy.  MOTOR: There is no pronator drift of out-stretched arms. Muscle bulk and tone are normal. Muscle strength is normal.  REFLEXES: Reflexes are 2+ and symmetric at the biceps, triceps, knees, and ankles. Plantar responses are flexor.  SENSORY: Intact to light touch, pinprick, positional and vibratory sensation are intact in fingers and toes.  COORDINATION: Rapid alternating movements and fine finger movements are intact. There is no dysmetria on finger-to-nose and heel-knee-shin.    GAIT/STANCE: Posture is normal. Gait is steady with normal steps, base, arm swing, and turning. Heel and toe walking are normal. Tandem gait is normal.  Romberg is absent.  DIAGNOSTIC DATA (LABS, IMAGING, TESTING)  ASSESSMENT AND PLAN Dementia with agitation  Progressive worsening  Continue Aricept 10 mg daily, Namenda 10 mg twice a day  Add on seroquel '25mg'$  every day  I also provide information with [facility].  Home health nursing and social worker    Marcial Pacas. M.D. Ph.D. [facility] [address], [place], [place] [address] [phone]

## 2017-08-22 ENCOUNTER — Other Ambulatory Visit: Payer: Self-pay | Admitting: Family Medicine

## 2017-08-22 DIAGNOSIS — Z0000 Encounter for general adult medical examination without abnormal findings: Secondary | ICD-10-CM

## 2017-08-22 DIAGNOSIS — E785 Hyperlipidemia, unspecified: Secondary | ICD-10-CM

## 2017-08-22 DIAGNOSIS — E781 Pure hyperglyceridemia: Secondary | ICD-10-CM

## 2017-08-25 ENCOUNTER — Other Ambulatory Visit: Payer: Self-pay | Admitting: Family Medicine

## 2017-08-25 DIAGNOSIS — E785 Hyperlipidemia, unspecified: Secondary | ICD-10-CM

## 2017-08-25 DIAGNOSIS — Z0000 Encounter for general adult medical examination without abnormal findings: Secondary | ICD-10-CM

## 2017-09-13 ENCOUNTER — Other Ambulatory Visit: Payer: Self-pay | Admitting: Neurology

## 2017-10-03 ENCOUNTER — Ambulatory Visit: Payer: Medicare Other | Admitting: Family Medicine

## 2017-10-03 ENCOUNTER — Encounter: Payer: Self-pay | Admitting: Family Medicine

## 2017-10-03 VITALS — BP 138/60 | HR 110 | Temp 98.6°F | Resp 16 | Ht 62.0 in | Wt 145.0 lb

## 2017-10-03 DIAGNOSIS — M8580 Other specified disorders of bone density and structure, unspecified site: Secondary | ICD-10-CM | POA: Diagnosis not present

## 2017-10-03 DIAGNOSIS — E785 Hyperlipidemia, unspecified: Secondary | ICD-10-CM

## 2017-10-03 DIAGNOSIS — E781 Pure hyperglyceridemia: Secondary | ICD-10-CM | POA: Diagnosis not present

## 2017-10-03 DIAGNOSIS — Z23 Encounter for immunization: Secondary | ICD-10-CM

## 2017-10-03 DIAGNOSIS — Z0000 Encounter for general adult medical examination without abnormal findings: Secondary | ICD-10-CM | POA: Diagnosis not present

## 2017-10-03 DIAGNOSIS — E039 Hypothyroidism, unspecified: Secondary | ICD-10-CM | POA: Diagnosis not present

## 2017-10-03 LAB — LIPID PANEL
Cholesterol: 147 mg/dL (ref 0–200)
HDL: 48.1 mg/dL (ref >39.00)
LDL Cholesterol: 76 mg/dL (ref 0–99)
NonHDL: 98.65
TRIGLYCERIDES: 112 mg/dL (ref 0.0–149.0)
Total CHOL/HDL Ratio: 3
VLDL: 22.4 mg/dL (ref 0.0–40.0)

## 2017-10-03 LAB — COMPREHENSIVE METABOLIC PANEL
ALBUMIN: 4.2 g/dL (ref 3.5–5.2)
ALK PHOS: 58 U/L (ref 39–117)
ALT: 19 U/L (ref 0–35)
AST: 21 U/L (ref 0–37)
BILIRUBIN TOTAL: 0.6 mg/dL (ref 0.2–1.2)
BUN: 14 mg/dL (ref 6–23)
CALCIUM: 9.7 mg/dL (ref 8.4–10.5)
CO2: 30 mEq/L (ref 19–32)
Chloride: 107 mEq/L (ref 96–112)
Creatinine, Ser: 0.83 mg/dL (ref 0.40–1.20)
GFR: 73.42 mL/min (ref >60.00)
Glucose, Bld: 99 mg/dL (ref 70–99)
Potassium: 4.2 mEq/L (ref 3.5–5.1)
Sodium: 144 mEq/L (ref 135–145)
TOTAL PROTEIN: 6.6 g/dL (ref 6.0–8.3)

## 2017-10-03 LAB — TSH: TSH: 0.74 u[IU]/mL (ref 0.35–4.50)

## 2017-10-03 MED ORDER — FENOFIBRATE 160 MG PO TABS: ORAL_TABLET | ORAL | 1 refills | Status: DC

## 2017-10-03 MED ORDER — SYNTHROID 112 MCG PO TABS: ORAL_TABLET | ORAL | 3 refills | Status: DC

## 2017-10-03 MED ORDER — ATORVASTATIN CALCIUM 20 MG PO TABS: ORAL_TABLET | ORAL | 1 refills | Status: DC

## 2017-10-03 MED ORDER — RALOXIFENE HCL 60 MG PO TABS: ORAL_TABLET | ORAL | 1 refills | Status: DC

## 2017-10-03 NOTE — Patient Instructions (Signed)
Preventive Care 40-64 Years, Female Preventive care refers to lifestyle choices and visits with your health care provider that can promote health and wellness. What does preventive care include?  A yearly physical exam. This is also called an annual well check.  Dental exams once or twice a year.  Routine eye exams. Ask your health care provider how often you should have your eyes checked.  Personal lifestyle choices, including: ? Daily care of your teeth and gums. ? Regular physical activity. ? Eating a healthy diet. ? Avoiding tobacco and drug use. ? Limiting alcohol use. ? Practicing safe sex. ? Taking low-dose aspirin daily starting at age 58. ? Taking vitamin and mineral supplements as recommended by your health care provider. What happens during an annual well check? The services and screenings done by your health care provider during your annual well check will depend on your age, overall health, lifestyle risk factors, and family history of disease. Counseling Your health care provider may ask you questions about your:  Alcohol use.  Tobacco use.  Drug use.  Emotional well-being.  Home and relationship well-being.  Sexual activity.  Eating habits.  Work and work Statistician.  Method of birth control.  Menstrual cycle.  Pregnancy history.  Screening You may have the following tests or measurements:  Height, weight, and BMI.  Blood pressure.  Lipid and cholesterol levels. These may be checked every 5 years, or more frequently if you are over 81 years old.  Skin check.  Lung cancer screening. You may have this screening every year starting at age 78 if you have a 30-pack-year history of smoking and currently smoke or have quit within the past 15 years.  Fecal occult blood test (FOBT) of the stool. You may have this test every year starting at age 65.  Flexible sigmoidoscopy or colonoscopy. You may have a sigmoidoscopy every 5 years or a colonoscopy  every 10 years starting at age 30.  Hepatitis C blood test.  Hepatitis B blood test.  Sexually transmitted disease (STD) testing.  Diabetes screening. This is done by checking your blood sugar (glucose) after you have not eaten for a while (fasting). You may have this done every 1-3 years.  Mammogram. This may be done every 1-2 years. Talk to your health care provider about when you should start having regular mammograms. This may depend on whether you have a family history of breast cancer.  BRCA-related cancer screening. This may be done if you have a family history of breast, ovarian, tubal, or peritoneal cancers.  Pelvic exam and Pap test. This may be done every 3 years starting at age 80. Starting at age 36, this may be done every 5 years if you have a Pap test in combination with an HPV test.  Bone density scan. This is done to screen for osteoporosis. You may have this scan if you are at high risk for osteoporosis.  Discuss your test results, treatment options, and if necessary, the need for more tests with your health care provider. Vaccines Your health care provider may recommend certain vaccines, such as:  Influenza vaccine. This is recommended every year.  Tetanus, diphtheria, and acellular pertussis (Tdap, Td) vaccine. You may need a Td booster every 10 years.  Varicella vaccine. You may need this if you have not been vaccinated.  Zoster vaccine. You may need this after age 5.  Measles, mumps, and rubella (MMR) vaccine. You may need at least one dose of MMR if you were born in  1957 or later. You may also need a second dose.  Pneumococcal 13-valent conjugate (PCV13) vaccine. You may need this if you have certain conditions and were not previously vaccinated.  Pneumococcal polysaccharide (PPSV23) vaccine. You may need one or two doses if you smoke cigarettes or if you have certain conditions.  Meningococcal vaccine. You may need this if you have certain  conditions.  Hepatitis A vaccine. You may need this if you have certain conditions or if you travel or work in places where you may be exposed to hepatitis A.  Hepatitis B vaccine. You may need this if you have certain conditions or if you travel or work in places where you may be exposed to hepatitis B.  Haemophilus influenzae type b (Hib) vaccine. You may need this if you have certain conditions.  Talk to your health care provider about which screenings and vaccines you need and how often you need them. This information is not intended to replace advice given to you by your health care provider. Make sure you discuss any questions you have with your health care provider. Document Released: 01/28/2015 Document Revised: 09/21/2015 Document Reviewed: 11/02/2014 Elsevier Interactive Patient Education  2018 [employer]  

## 2017-10-03 NOTE — Progress Notes (Signed)
Subjective:     Jennifer Small is a 62 y.o. female and is here for a comprehensive physical exam. The patient reports no new problems --- pt f/u with neuro for dementia.  it is slowly progressing .  Social History   Socioeconomic History  . Marital status: Married    Spouse name: [name]  . Number of children: 2  . Years of education: college  . Highest education level: Not on file  Occupational History    Comment: disability    Employer: [employer]  Social Needs  . Financial resource strain: Not on file  . Food insecurity:    Worry: Not on file    Inability: Not on file  . Transportation needs:    Medical: Not on file    Non-medical: Not on file  Tobacco Use  . Smoking status: Never Smoker  . Smokeless tobacco: Never Used  Substance and Sexual Activity  . Alcohol use: Yes    Alcohol/week: 1.0 standard drinks    Types: 1 drink(s) per week    Comment: rare  . Drug use: No  . Sexual activity: Yes    Partners: Male  Lifestyle  . Physical activity:    Days per week: Not on file    Minutes per session: Not on file  . Stress: Not on file  Relationships  . Social connections:    Talks on phone: Not on file    Gets together: Not on file    Attends religious service: Not on file    Active member of club or organization: Not on file    Attends meetings of clubs or organizations: Not on file    Relationship status: Not on file  . Intimate partner violence:    Fear of current or ex partner: Not on file    Emotionally abused: Not on file    Physically abused: Not on file    Forced sexual activity: Not on file  Other Topics Concern  . Not on file  Social History Narrative   GETS REG EXERCISE--walking   Patient lives at home with her husband [name]. ).    Patient is on disability    Patient has college education.   Right handed.   Caffeine.- One or two cups daily.            Health Maintenance  Topic Date Due  . INFLUENZA VACCINE  08/15/2017  .  TETANUS/TDAP  08/18/2024  . Hepatitis C Screening  Completed  . HIV Screening  Completed    The following portions of the patient's history were reviewed and updated as appropriate:  She  has a past medical history of Dementia, Hypothyroidism, Memory loss, Osteoporosis, and Thyroid disease. She does not have any pertinent problems on file. She  has a past surgical history that includes Cesarean section and Breast lumpectomy. Her family history includes Alzheimer's disease in her mother; Arthritis in her father; Dementia in her brother; Down syndrome in her brother; Gallbladder disease in her father. She  reports that she has never smoked. She has never used smokeless tobacco. She reports that she drinks about 1.0 standard drinks of alcohol per week. She reports that she does not use drugs. She has a current medication list which includes the following prescription(s): atorvastatin, donepezil, fenofibrate, memantine, multivitamin, quetiapine, raloxifene, and synthroid. Current Outpatient Medications on File Prior to Visit  Medication Sig Dispense Refill  . donepezil (ARICEPT) 10 MG tablet TAKE 1 TABLET(10 MG) BY MOUTH DAILY 90 tablet 0  .  memantine (NAMENDA) 10 MG tablet TAKE 1 TABLET(10 MG) BY MOUTH TWICE DAILY 180 tablet 0  . Multiple Vitamin (MULTIVITAMIN) tablet Take 1 tablet by mouth daily.      . QUEtiapine (SEROQUEL) 25 MG tablet Take 1 tablet (25 mg total) by mouth daily. 30 tablet 11   No current facility-administered medications on file prior to visit.    She has No Known Allergies..  Review of Systems Review of Systems  Constitutional: Negative for activity change, appetite change and fatigue.  HENT: Negative for hearing loss, congestion, tinnitus and ear discharge.  dentist [id number] Eyes: Negative for visual disturbance (see optho q1y -- vision corrected to 20/20 with glasses).  Respiratory: Negative for cough, chest tightness and shortness of breath.   Cardiovascular: Negative for  chest pain, palpitations and leg swelling.  Gastrointestinal: Negative for abdominal pain, diarrhea, constipation and abdominal distention.  Genitourinary: Negative for urgency, frequency, decreased urine volume and difficulty urinating.  Musculoskeletal: Negative for back pain, arthralgias and gait problem.  Skin: Negative for color change, pallor and rash.  Neurological: Negative for dizziness, light-headedness, numbness and headaches.  Hematological: Negative for adenopathy. Does not bruise/bleed easily.  Psychiatric/Behavioral:  Memory issues slowly progressing      Objective:    BP 138/60 (BP Location: Right Arm, Patient Position: Sitting, Cuff Size: Small)   Pulse (!) 110   Temp 98.6 F (37 C) (Oral)   Resp 16   Ht 5\' 2"  (1.575 m)   Wt 145 lb (65.8 kg)   SpO2 100%   BMI 26.52 kg/m  General appearance: alert, cooperative, appears stated age and no distress Head: Normocephalic, without obvious abnormality, atraumatic Eyes: conjunctivae/corneas clear. PERRL, EOM's intact. Fundi benign. Ears: normal TM's and external ear canals both ears Nose: Nares normal. Septum midline. Mucosa normal. No drainage or sinus tenderness. Throat: lips, mucosa, and tongue normal; teeth and gums normal Neck: no adenopathy, no carotid bruit, no JVD, supple, symmetrical, trachea midline and thyroid not enlarged, symmetric, no tenderness/mass/nodules Back: symmetric, no curvature. ROM normal. No CVA tenderness. Lungs: clear to auscultation bilaterally Breasts: pt preferred not to do  Heart: regular rate and rhythm, S1, S2 normal, no murmur, click, rub or gallop Abdomen: soft, non-tender; bowel sounds normal; no masses,  no organomegaly Pelvic: not indicated; post-menopausal, no abnormal Pap smears in past Extremities: extremities normal, atraumatic, no cyanosis or edema Pulses: 2+ and symmetric Skin: Skin color, texture, turgor normal. No rashes or lesions Lymph nodes: Cervical, supraclavicular,  and axillary nodes normal. Neurologic: Alert and oriented X 3, normal strength and tone. Normal symmetric reflexes. Normal coordination and gait    Assessment:    Healthy female exam.      Plan:     ghm utd Check labs  See After Visit Summary for Counseling Recommendations    1. Needs flu shot  - Flu Vaccine QUAD 36+ mos IM (Fluarix & Fluzone Quad PF  2. Preventative health care See above - SYNTHROID 112 MCG tablet; TAKE 1 TABLET BY MOUTH EVERY MORNING BEFORE BREAKFAST  Dispense: 90 tablet; Refill: 3 - raloxifene (EVISTA) 60 MG tablet; TAKE 1 TABLET(60 MG) BY MOUTH DAILY  Dispense: 90 tablet; Refill: 1 - fenofibrate 160 MG tablet; TAKE 1 TABLET(160 MG) BY MOUTH DAILY  Dispense: 90 tablet; Refill: 1 - atorvastatin (LIPITOR) 20 MG tablet; TAKE 1 TABLET(20 MG) BY MOUTH AT BEDTIME  Dispense: 90 tablet; Refill: 1  3. Hypothyroidism, unspecified type Check labs  - SYNTHROID 112 MCG tablet; TAKE 1 TABLET BY MOUTH  EVERY MORNING BEFORE BREAKFAST  Dispense: 90 tablet; Refill: 3 - TSH  4. Osteopenia   - raloxifene (EVISTA) 60 MG tablet; TAKE 1 TABLET(60 MG) BY MOUTH DAILY  Dispense: 90 tablet; Refill: 1  5. High triglycerides Check labs  - fenofibrate 160 MG tablet; TAKE 1 TABLET(160 MG) BY MOUTH DAILY  Dispense: 90 tablet; Refill: 1 - Lipid panel - Comprehensive metabolic panel  6. Hyperlipidemia, unspecified hyperlipidemia type Tolerating statin, encouraged heart healthy diet, avoid trans fats, minimize simple carbs and saturated fats. Increase exercise as tolerated - fenofibrate 160 MG tablet; TAKE 1 TABLET(160 MG) BY MOUTH DAILY  Dispense: 90 tablet; Refill: 1 - atorvastatin (LIPITOR) 20 MG tablet; TAKE 1 TABLET(20 MG) BY MOUTH AT BEDTIME  Dispense: 90 tablet; Refill: 1 - Lipid panel - Comprehensive metabolic panel

## 2017-10-09 ENCOUNTER — Encounter: Payer: Self-pay | Admitting: *Deleted

## 2017-11-11 ENCOUNTER — Telehealth: Payer: Self-pay | Admitting: Neurology

## 2017-11-11 MED ORDER — QUETIAPINE FUMARATE 25 MG PO TABS: ORAL_TABLET | ORAL | 11 refills | Status: DC

## 2017-11-11 NOTE — Addendum Note (Signed)
Addended by: Lilla Shook on: 11/11/2017 03:48 PM   Modules accepted: Orders

## 2017-11-11 NOTE — Telephone Encounter (Addendum)
Spoke to patient's husband, Onalee Hua (on [place]).  He has just restarted using Seroquel 12.5mg  during the day which has only been mildly helpful.  He was hesitant to use more until clearing it with Dr. Terrace Arabia first.  He could not remember if it was actually prescribed to help with agitation or just sleep.  He is now aware it can help with both.  Per vo by Dr. Terrace Arabia, provide new rx for quetiapine 25mg , taking one tablet in am and two tablets at bedtime as needed.  He is agreeable and appreciative of this new plan.  Says her behavior has really declined recently.  He will attempt the higher dose and only use as little as needed.  He was instructed to call us back with any further concerns.  New rx sent to the pharmacy.

## 2017-11-11 NOTE — Telephone Encounter (Signed)
Patient's husband Onalee Hua calling stating for the last week and a half patient has become very agitated, cursing, clapping and is very loud. Is there something that can be called to [facility] on [address] to calm her down. Please call and discuss.

## 2017-11-26 ENCOUNTER — Other Ambulatory Visit: Payer: Self-pay | Admitting: Family Medicine

## 2017-11-26 DIAGNOSIS — E781 Pure hyperglyceridemia: Secondary | ICD-10-CM

## 2017-11-26 DIAGNOSIS — Z0000 Encounter for general adult medical examination without abnormal findings: Secondary | ICD-10-CM

## 2017-11-26 DIAGNOSIS — E785 Hyperlipidemia, unspecified: Secondary | ICD-10-CM

## 2017-12-18 ENCOUNTER — Other Ambulatory Visit: Payer: Self-pay | Admitting: Neurology

## 2018-02-18 ENCOUNTER — Encounter: Payer: Self-pay | Admitting: *Deleted

## 2018-02-18 ENCOUNTER — Telehealth: Payer: Self-pay | Admitting: Neurology

## 2018-02-18 NOTE — Telephone Encounter (Signed)
Letter written, signed by Dr. Terrace Arabia and placed in mail today.

## 2018-02-18 NOTE — Telephone Encounter (Signed)
Pt's husband Onalee Hua has seen elder care lawyer. Before anything can proceed he needs competency letter from Dr Terrace Arabia stating pt is unable to take make life decisions on her own. Please call to advise.

## 2018-02-18 NOTE — Telephone Encounter (Addendum)
Returned call to patient's husband, Onalee Hua (on [place]).  He needs a letter stating the patient is no longer able to handle financial affairs unassisted.  She is seen in our office yearly.  Her last MMSE was completed in July 2017.  She scored 8 out of 30.  He needs this letter mailed to their home address:  [address] [facility] [address]

## 2018-03-27 ENCOUNTER — Other Ambulatory Visit: Payer: Self-pay | Admitting: Family Medicine

## 2018-03-27 DIAGNOSIS — Z0000 Encounter for general adult medical examination without abnormal findings: Secondary | ICD-10-CM

## 2018-03-27 DIAGNOSIS — M8580 Other specified disorders of bone density and structure, unspecified site: Secondary | ICD-10-CM

## 2018-04-03 ENCOUNTER — Ambulatory Visit: Payer: Medicare Other | Admitting: Family Medicine

## 2018-04-22 ENCOUNTER — Telehealth: Payer: Self-pay | Admitting: *Deleted

## 2018-04-22 NOTE — Telephone Encounter (Signed)
Pt's spouse declines Virtual Visit. He states he "is not going to pay a copay to just talk to the doctor on the phone". I tried to explain the benefit of Virtual Visits during this time and the exceptions we are having to make during the pandemic but he declines Virtual Visit at this time and states he will wait for in office visit in May. OV is set for 06/05/18.

## 2018-06-05 ENCOUNTER — Ambulatory Visit: Payer: Medicare Other | Admitting: Family Medicine

## 2018-07-24 ENCOUNTER — Other Ambulatory Visit: Payer: Self-pay

## 2018-07-24 ENCOUNTER — Telehealth: Payer: Self-pay | Admitting: Family Medicine

## 2018-07-24 DIAGNOSIS — Z0000 Encounter for general adult medical examination without abnormal findings: Secondary | ICD-10-CM

## 2018-07-24 DIAGNOSIS — E781 Pure hyperglyceridemia: Secondary | ICD-10-CM

## 2018-07-24 DIAGNOSIS — E785 Hyperlipidemia, unspecified: Secondary | ICD-10-CM

## 2018-07-24 MED ORDER — ATORVASTATIN CALCIUM 20 MG PO TABS: ORAL_TABLET | ORAL | 0 refills | Status: DC

## 2018-07-24 MED ORDER — FENOFIBRATE 160 MG PO TABS: ORAL_TABLET | ORAL | 0 refills | Status: DC

## 2018-07-24 NOTE — Telephone Encounter (Signed)
Refill sent.

## 2018-07-24 NOTE — Telephone Encounter (Signed)
Pt will need refills on Atorvastatin & fenofibrate until CPE scheduled for 11/04/2018. Pt has 10-14 days on hand.   [id number] - [address], [address] AT [facility] [address] [phone] (Phone) [phone] (Fax)

## 2018-08-12 NOTE — Progress Notes (Signed)
PATIENT: Jennifer Small DOB: 10-Nov-1955  REASON FOR VISIT: follow up HISTORY FROM: patient  HISTORY OF PRESENT ILLNESS: Today 08/13/18  HISTORY  HISTORY Jennifer Small is a 63 years old right-handed Caucasian female, accompanied by her husband, referred by her primary care physician Dr. Etter Small for evaluation of memory loss since 2012.   She has past medical history of hypothyroidism, on supplement, hyperlipidemia, she had college degree, majored in Pharmacologist, she is a Photographer for [employer], Since 2012, her husband noticed she has confusion episode, confused day of the week, do not know how to set up a clock, anxious, losing confident easily, getting worse over the past year, Herself denied any significant difficulty, denied difficulty handling her job, but getting very frustrated easily during office visit. She was diagnosed with hypothyroidism in early 2013, I don't know exact pathology, she was started on thyroid supplement, husband reported mild improvement  There was no significant family history of dementia,  MRI of the brain has demonstrated mild atrophy On follow up visit, she tends to be very agitated, refuse MMSE exam. CSF showed TP 45, Glucose67, VDRL-, ACE0, OCB-, wbc1/rbc 0,  EEG showed frequent spkie-slow burst, increased apperance during photic stimulation. Hyperventilation also induced prolonged long, abnormal generalied slow, spike slow activites. I have started her on Depakote xr, she could not tolerate clonazapam, has excessive drowsy. She was evaluated by Dr Jennifer Small in Early 2013, confirmed global and severe cognitive deterioration marked by impairments in multiple cognitive domains, including orientation, attention, mental processing speed, immediate   Labs showed positive ANA, dsDNA, thyroid globulin antibody,elevated TSH 14.7, normal thyroid peroxidase, negative or normal CBC, CMP, HIV, Lyme, VitD, RPR, ESR, CPK, Vit B12, CRP. mildly  elevated LDL 116, triglyceride 189, normal CBC, vitamin B12 1474,   UPDATE June 2014: I started her on a trial of prednisone up to 20 mg 2 tablets for one to 2 weeks in April 2014, without significant improvement, she was also evaluated by her rheumatologist Dr. Lenn Small during that period of time, I do not have the feedback, but per patient, there was no definite connective tissue disease, patient failed to respond to prednisone treatment, it was tapered under the guidance of Dr. Luan Small in 2-3 weeks.She denies gait difficulty, on disability now, but confused easily, only drive very short distance,   UPDATE Oct 14th 2014:She is about the same, she is functional at home, she cooks some, she is home with dog, take dog for a walk. She has quit driving, she does not want to go to [place] for second opinion  UPDATE July 14th 2015:Her husband retired, they are able to spend more time together, travels some, maintain the house, garden, she sleeps well, eats well, no vision difficulty no gait difficulty, no incontinence. She is taking Namenda, Aricept, Depakote ER 250 mg 2 tablets at night.  Update August 06 2016: I reviewed and summarized the consultation note from [place] in 2016, confirmed the diagnosis of dementia,  She continue to progress, she tends to more anxious, she tends to be easily frustrated, is taking Aricept 10 mg daily, Namenda 10 mg twice a day.   UPDATE August 07 2017: She continued to decline steadily, her husband has retired, spends most of his days taking care of her, she could not carry on a normal conversation, tends to be agitated during the daytime, pacing around.  She has no difficulty sleeping, but has daytime agitation.  Update August 13, 2018 SS: Jennifer Small is a  63 year old female with dementia with agitation, progressively worsening.  She remains on Aricept and Namenda.  She is no longer taking Seroquel, her husband felt it made her agitation worse.  He said she tried it  for 6 weeks.  In the last year, she has had more agitation, less appetite, some urinary incontinence. They still go for walks everyday about 1/2 mile. In the last year, she has had about 10 lb weight loss. She goes to sleep well, around 10 pm. She may wake up around 5 am. She will have occasional wandering if she gets up to use bathroom. Her husband has to assist with her ADLs. .She likes to watch TV, play with the dogs. Her daughter in law comes a few times a week to allow her husband to run errands. Her verbal response is very limited. She understands and will follow command. Her overall health is good. Her husband is asking if she can be prescribed a light sedative to help with times of agitation.  In the last few months, she has had some urinary incontinence.  She has not had any falls.  REVIEW OF SYSTEMS: Out of a complete 14 system review of symptoms, the patient complains only of the following symptoms, and all other reviewed systems are negative.  Memory loss, urinary incontinence  ALLERGIES: No Known Allergies  HOME MEDICATIONS: Outpatient Medications Prior to Visit  Medication Sig Dispense Refill  . atorvastatin (LIPITOR) 20 MG tablet TAKE 1 TABLET(20 MG) BY MOUTH AT BEDTIME 90 tablet 0  . fenofibrate 160 MG tablet TAKE 1 TABLET(160 MG) BY MOUTH DAILY 90 tablet 0  . Multiple Vitamin (MULTIVITAMIN) tablet Take 1 tablet by mouth daily.      . raloxifene (EVISTA) 60 MG tablet TAKE 1 TABLET(60 MG) BY MOUTH DAILY 90 tablet 1  . SYNTHROID 112 MCG tablet TAKE 1 TABLET BY MOUTH EVERY MORNING BEFORE BREAKFAST 90 tablet 3  . donepezil (ARICEPT) 10 MG tablet TAKE 1 TABLET(10 MG) BY MOUTH DAILY 90 tablet 3  . memantine (NAMENDA) 10 MG tablet TAKE 1 TABLET(10 MG) BY MOUTH TWICE DAILY 180 tablet 3  . QUEtiapine (SEROQUEL) 25 MG tablet Take one tablet in am and two tablets at bedtime as needed. 90 tablet 11   No facility-administered medications prior to visit.     PAST MEDICAL HISTORY: Past  Medical History:  Diagnosis Date  . Dementia ([place])   . Hypothyroidism   . Memory loss   . Osteoporosis   . Thyroid disease    Hypothyroidism    PAST SURGICAL HISTORY: Past Surgical History:  Procedure Laterality Date  . BREAST LUMPECTOMY    . CESAREAN SECTION      FAMILY HISTORY: Family History  Problem Relation Age of Onset  . Alzheimer's disease Mother   . Arthritis Father   . Gallbladder disease Father   . Down syndrome Brother   . Dementia Brother     SOCIAL HISTORY: Social History   Socioeconomic History  . Marital status: Married    Spouse name: [name]  . Number of children: 2  . Years of education: college  . Highest education level: Not on file  Occupational History    Comment: disability    Employer: [employer]  . Financial resource strain: Not on file  . Food insecurity    Worry: Not on file    Inability: Not on file  . Transportation needs    Medical: Not on file    Non-medical: Not  on file  Tobacco Use  . Smoking status: Never Smoker  . Smokeless tobacco: Never Used  Substance and Sexual Activity  . Alcohol use: Yes    Alcohol/week: 1.0 standard drinks    Types: 1 drink(s) per week    Comment: rare  . Drug use: No  . Sexual activity: Yes    Partners: Male  Lifestyle  . Physical activity    Days per week: Not on file    Minutes per session: Not on file  . Stress: Not on file  Relationships  . Social Herbalist on phone: Not on file    Gets together: Not on file    Attends religious service: Not on file    Active member of club or organization: Not on file    Attends meetings of clubs or organizations: Not on file    Relationship status: Not on file  . Intimate partner violence    Fear of current or ex partner: Not on file    Emotionally abused: Not on file    Physically abused: Not on file    Forced sexual activity: Not on file  Other Topics Concern  . Not on file  Social History Narrative    GETS REG EXERCISE--walking   Patient lives at home with her husband [name]. ).    Patient is on disability    Patient has college education.   Right handed.   Caffeine.- One or two cups daily.              MMSE - Mini Mental State Exam 08/13/2018 08/01/2015 07/28/2013  Not completed: Unable to complete - -  Orientation to time - 0 3  Orientation to Place - 0 3  Registration - 3 0  Attention/ Calculation - 0 0  Recall - 1 1  Language- name 2 objects - 1 2  Language- repeat - 1 1  Language- follow 3 step command - 2 3  Language- read & follow direction - 0 1  Write a sentence - 0 1  Copy design - 0 1  Total score - 8 16    PHYSICAL EXAM  Vitals:   08/13/18 1052  BP: 129/70  Pulse: 87  Temp: 98.2 F (36.8 C)  Weight: 137 lb 9.6 oz (62.4 kg)  Height: _0  (1.575 m)   Body mass index is 25.17 kg/m.  Generalized: Well developed, in no acute distress   Neurological examination  Mentation: Alert, history provided by husband, patient has very limited verbal response, she did say "hey", and "thank you" appropriately, limited ability to follow exam commands, she is smiling, occasionally tearful Cranial nerve II-XII: Pupils were equal round reactive to light. Extraocular movements were full, visual field were full on confrontational test.  Motor: The motor testing reveals 5 over 5 strength of all 4 extremities. Good symmetric motor tone is noted throughout.  Sensory: Sensory testing is intact to soft touch on all 4 extremities. No evidence of extinction is noted.  Coordination: Unable to perform Gait and station: Gait is normal, but slow Reflexes: Deep tendon reflexes are symmetric and normal bilaterally.   DIAGNOSTIC DATA (LABS, IMAGING, TESTING) - I reviewed patient records, labs, notes, testing and imaging myself where available.  Lab Results  Component Value Date   WBC 6.6 09/24/2016   HGB 14.8 09/24/2016   HCT 44.1 09/24/2016   MCV 94.1 09/24/2016   PLT 282.0  09/24/2016      Component Value Date/Time  NA 144 10/03/2017 0940   K 4.2 10/03/2017 0940   CL 107 10/03/2017 0940   CO2 30 10/03/2017 0940   GLUCOSE 99 10/03/2017 0940   BUN 14 10/03/2017 0940   CREATININE 0.83 10/03/2017 0940   CALCIUM 9.7 10/03/2017 0940   PROT 6.6 10/03/2017 0940   ALBUMIN 4.2 10/03/2017 0940   AST 21 10/03/2017 0940   ALT 19 10/03/2017 0940   ALKPHOS 58 10/03/2017 0940   BILITOT 0.6 10/03/2017 0940   GFRNONAA 86.21 05/24/2009 1003   Lab Results  Component Value Date   CHOL 147 10/03/2017   HDL 48.10 10/03/2017   LDLCALC 76 10/03/2017   LDLDIRECT 108.8 06/26/2012   TRIG 112.0 10/03/2017   CHOLHDL 3 10/03/2017   Lab Results  Component Value Date   HGBA1C 5.6 08/19/2014   Lab Results  Component Value Date   VITAMINB12 1,474 (H) 06/18/2011   Lab Results  Component Value Date   TSH 0.74 10/03/2017      ASSESSMENT AND PLAN 63 y.o. year old female  has a past medical history of Dementia ([place]), Hypothyroidism, Memory loss, Osteoporosis, and Thyroid disease. here with:  Dementia with Agitation -Progressive worsening, now has some urinary incontinence, she was unable to complete memory score, very limited verbal response -Continue Aricept 10 mg daily, Namenda 10 mg twice daily, sent refills -We will stop Seroquel, she tried it for about 6 weeks, her husband felt it made her agitation worse -We will try Xanax 0.25 mg tablet, twice a day as needed for agitation, her husband does not want to give her antipsychotics, I sent a small prescription for them to try only when agitation is significant and prolonged, discussed this with Dr. Krista Blue -Need to watch her weight in the future, may need to decrease dose of Aricept or discontinue if weight loss and poor appetite continue -She will follow-up in 6 months or sooner if needed with Dr. Krista Blue   I spent 15 minutes with the patient. 50% of this time was spent discussing her plan of care   Evangeline Dakin,  DNP 08/13/2018, 12:05 PM [facility] [address], [place], [place] [address] [phone]

## 2018-08-13 ENCOUNTER — Ambulatory Visit (INDEPENDENT_AMBULATORY_CARE_PROVIDER_SITE_OTHER): Payer: Medicare Other | Admitting: Neurology

## 2018-08-13 ENCOUNTER — Encounter: Payer: Self-pay | Admitting: Neurology

## 2018-08-13 ENCOUNTER — Other Ambulatory Visit: Payer: Self-pay

## 2018-08-13 ENCOUNTER — Ambulatory Visit: Payer: Medicare Other | Admitting: Nurse Practitioner

## 2018-08-13 VITALS — BP 129/70 | HR 87 | Temp 98.2°F | Ht 62.0 in | Wt 137.6 lb

## 2018-08-13 DIAGNOSIS — F0391 Unspecified dementia with behavioral disturbance: Secondary | ICD-10-CM

## 2018-08-13 MED ORDER — DONEPEZIL HCL 10 MG PO TABS: ORAL_TABLET | ORAL | 3 refills | Status: AC

## 2018-08-13 MED ORDER — ALPRAZOLAM 0.25 MG PO TABS: 0.2500 mg | ORAL_TABLET | Freq: Two times a day (BID) | ORAL | 1 refills | Status: DC | PRN

## 2018-08-13 MED ORDER — MEMANTINE HCL 10 MG PO TABS: ORAL_TABLET | ORAL | 3 refills | Status: AC

## 2018-08-13 NOTE — Patient Instructions (Signed)
It was wonderful to meet you both today! I will send in a refill of Aricept and Namenda.

## 2018-08-13 NOTE — Progress Notes (Signed)
I have reviewed and agreed above plan. 

## 2018-09-19 ENCOUNTER — Other Ambulatory Visit: Payer: Self-pay | Admitting: Family Medicine

## 2018-09-19 DIAGNOSIS — Z0000 Encounter for general adult medical examination without abnormal findings: Secondary | ICD-10-CM

## 2018-09-19 DIAGNOSIS — E039 Hypothyroidism, unspecified: Secondary | ICD-10-CM

## 2018-09-21 ENCOUNTER — Other Ambulatory Visit: Payer: Self-pay | Admitting: Family Medicine

## 2018-09-21 DIAGNOSIS — M8580 Other specified disorders of bone density and structure, unspecified site: Secondary | ICD-10-CM

## 2018-09-21 DIAGNOSIS — Z0000 Encounter for general adult medical examination without abnormal findings: Secondary | ICD-10-CM

## 2018-10-21 ENCOUNTER — Other Ambulatory Visit: Payer: Self-pay | Admitting: *Deleted

## 2018-10-21 DIAGNOSIS — E781 Pure hyperglyceridemia: Secondary | ICD-10-CM

## 2018-10-21 DIAGNOSIS — Z0000 Encounter for general adult medical examination without abnormal findings: Secondary | ICD-10-CM

## 2018-10-21 DIAGNOSIS — E785 Hyperlipidemia, unspecified: Secondary | ICD-10-CM

## 2018-10-21 MED ORDER — ATORVASTATIN CALCIUM 20 MG PO TABS: ORAL_TABLET | ORAL | 1 refills | Status: DC

## 2018-10-21 MED ORDER — FENOFIBRATE 160 MG PO TABS: ORAL_TABLET | ORAL | 1 refills | Status: DC

## 2018-11-04 ENCOUNTER — Other Ambulatory Visit: Payer: Self-pay

## 2018-11-04 ENCOUNTER — Ambulatory Visit (INDEPENDENT_AMBULATORY_CARE_PROVIDER_SITE_OTHER): Payer: Medicare Other | Admitting: Family Medicine

## 2018-11-04 ENCOUNTER — Encounter: Payer: Self-pay | Admitting: Family Medicine

## 2018-11-04 VITALS — BP 110/70 | HR 58 | Temp 97.5°F | Resp 18 | Ht 62.0 in | Wt 138.8 lb

## 2018-11-04 DIAGNOSIS — G301 Alzheimer's disease with late onset: Secondary | ICD-10-CM

## 2018-11-04 DIAGNOSIS — Z23 Encounter for immunization: Secondary | ICD-10-CM

## 2018-11-04 DIAGNOSIS — F0281 Dementia in other diseases classified elsewhere with behavioral disturbance: Secondary | ICD-10-CM

## 2018-11-04 DIAGNOSIS — Z0000 Encounter for general adult medical examination without abnormal findings: Secondary | ICD-10-CM | POA: Diagnosis not present

## 2018-11-04 DIAGNOSIS — E785 Hyperlipidemia, unspecified: Secondary | ICD-10-CM

## 2018-11-04 DIAGNOSIS — E039 Hypothyroidism, unspecified: Secondary | ICD-10-CM | POA: Diagnosis not present

## 2018-11-04 DIAGNOSIS — I10 Essential (primary) hypertension: Secondary | ICD-10-CM

## 2018-11-04 DIAGNOSIS — F02818 Dementia in other diseases classified elsewhere, unspecified severity, with other behavioral disturbance: Secondary | ICD-10-CM

## 2018-11-04 NOTE — Assessment & Plan Note (Signed)
Well controlled, no changes to meds. Encouraged heart healthy diet such as the DASH diet and exercise as tolerated.  °

## 2018-11-04 NOTE — Progress Notes (Signed)
Subjective:     Jennifer ShayMary Small is a 63 y.o. female and is here for a comprehensive physical exam. The patient reports no new problems.  Her husband is with her and is giving hx.  Her dementia is slowly worsening but she is sleeping well at night.  She does get agitated int the evenings and neuro gave him xanax to give her---  She really does not help much.  No other complaints He would like her to get her flu shot today.    Social History   Socioeconomic History  . Marital status: Married    Spouse name: [name]  . Number of children: 2  . Years of education: college  . Highest education level: Not on file  Occupational History    Comment: disability    Employer: [employer]  Social Needs  . Financial resource strain: Not on file  . Food insecurity    Worry: Not on file    Inability: Not on file  . Transportation needs    Medical: Not on file    Non-medical: Not on file  Tobacco Use  . Smoking status: Never Smoker  . Smokeless tobacco: Never Used  Substance and Sexual Activity  . Alcohol use: Yes    Alcohol/week: 1.0 standard drinks    Types: 1 drink(s) per week    Comment: rare  . Drug use: No  . Sexual activity: Yes    Partners: Male  Lifestyle  . Physical activity    Days per week: Not on file    Minutes per session: Not on file  . Stress: Not on file  Relationships  . Social Musicianconnections    Talks on phone: Not on file    Gets together: Not on file    Attends religious service: Not on file    Active member of club or organization: Not on file    Attends meetings of clubs or organizations: Not on file    Relationship status: Not on file  . Intimate partner violence    Fear of current or ex partner: Not on file    Emotionally abused: Not on file    Physically abused: Not on file    Forced sexual activity: Not on file  Other Topics Concern  . Not on file  Social History Narrative   GETS REG EXERCISE--walking   Patient lives at home with her husband  [name].    Patient is on disability    Patient has college education.   Right handed.   Caffeine.- One or two cups daily.            Health Maintenance  Topic Date Due  . PNA vac Low Risk Adult (1 of 2 - PCV13) 11/04/2019 (Originally 02/28/2018)  . TETANUS/TDAP  08/18/2024  . INFLUENZA VACCINE  Completed  . DEXA SCAN  Completed  . Hepatitis C Screening  Completed  . HIV Screening  Completed    The following portions of the patient's history were reviewed and updated as appropriate: She  has a past medical history of Dementia (HCC), Hypothyroidism, Memory loss, Osteoporosis, and Thyroid disease. She does not have any pertinent problems on file. She  has a past surgical history that includes Cesarean section and Breast lumpectomy. Her family history includes Alzheimer's disease in her mother; Arthritis in her father; Dementia in her brother; Down syndrome in her brother; Gallbladder disease in her father. She  reports that she has never smoked. She has never used smokeless tobacco. She  reports current alcohol use of about 1.0 standard drinks of alcohol per week. She reports that she does not use drugs. She has a current medication list which includes the following prescription(s): alprazolam, atorvastatin, donepezil, fenofibrate, memantine, multivitamin, raloxifene, and synthroid. Current Outpatient Medications on File Prior to Visit  Medication Sig Dispense Refill  . ALPRAZolam (XANAX) 0.25 MG tablet Take 1 tablet (0.25 mg total) by mouth 2 (two) times daily as needed for anxiety (for agitation). 20 tablet 1  . atorvastatin (LIPITOR) 20 MG tablet TAKE 1 TABLET(20 MG) BY MOUTH AT BEDTIME 90 tablet 1  . donepezil (ARICEPT) 10 MG tablet TAKE 1 TABLET(10 MG) BY MOUTH DAILY 90 tablet 3  . fenofibrate 160 MG tablet TAKE 1 TABLET(160 MG) BY MOUTH DAILY 90 tablet 1  . memantine (NAMENDA) 10 MG tablet TAKE 1 TABLET(10 MG) BY MOUTH TWICE DAILY 180 tablet 3  . Multiple Vitamin (MULTIVITAMIN)  tablet Take 1 tablet by mouth daily.      . raloxifene (EVISTA) 60 MG tablet TAKE 1 TABLET(60 MG) BY MOUTH DAILY 90 tablet 1  . SYNTHROID 112 MCG tablet TAKE 1 TABLET BY MOUTH EVERY MORNING BEFORE BREAKFAST 90 tablet 3   No current facility-administered medications on file prior to visit.    She has No Known Allergies..  Review of Systems Review of Systems  Constitutional: Negative for activity change, appetite change and fatigue.  HENT: Negative for hearing loss, congestion, tinnitus and ear discharge.  dentist q7m Eyes: Negative for visual disturbance (see optho q1y -- vision corrected to 20/20 with glasses).  Respiratory: Negative for cough, chest tightness and shortness of breath.   Cardiovascular: Negative for chest pain, palpitations and leg swelling.  Gastrointestinal: Negative for abdominal pain, diarrhea, constipation and abdominal distention.  Genitourinary: Negative for urgency, frequency, decreased urine volume and difficulty urinating.  Musculoskeletal: Negative for back pain, arthralgias and gait problem.  Skin: Negative for color change, pallor and rash.  Neurological: Negative for dizziness, light-headedness, numbness and headaches.  Hematological: Negative for adenopathy. Does not bruise/bleed easily.  Psychiatric/Behavioral: Negative for suicidal ideas, + confusion ,  Sleeps well   Memory worsening   Agitation in the evening      Objective:    BP 110/70 (BP Location: Left Arm, Patient Position: Sitting, Cuff Size: Normal)   Pulse (!) 58   Temp (!) 97.5 F (36.4 C) (Temporal)   Resp 18   Ht 5\' 2"  (1.575 m)   Wt 138 lb 12.8 oz (63 kg)   SpO2 98%   BMI 25.39 kg/m  General appearance: no distress and slowed mentation Head: Normocephalic, without obvious abnormality, atraumatic Eyes: negative findings: lids and lashes normal and conjunctivae and sclerae normal Ears: normal TM's and external ear canals both ears Nose: Nares normal. Septum midline. Mucosa  normal. No drainage or sinus tenderness. Throat: lips, mucosa, and tongue normal; teeth and gums normal Neck: no adenopathy, no carotid bruit, no JVD, supple, symmetrical, trachea midline and thyroid not enlarged, symmetric, no tenderness/mass/nodules Back: symmetric, no curvature. ROM normal. No CVA tenderness. Lungs: clear to auscultation bilaterally Breasts: refused  Heart: regular rate and rhythm, S1, S2 normal, no murmur, click, rub or gallop Abdomen: soft, non-tender; bowel sounds normal; no masses,  no organomegaly Pelvic: not indicated; post-menopausal, no abnormal Pap smears in past Extremities: extremities normal, atraumatic, no cyanosis or edema Pulses: 2+ and symmetric Skin: Skin color, texture, turgor normal. No rashes or lesions Lymph nodes: Cervical, supraclavicular, and axillary nodes normal. Neurologic: Mental status: alertness: lethargic,  orientation: disoriented-- unable to answer questions, affect: blunted    Assessment:    Healthy female exam.      Plan:     unable to give flu shot or get labs because pt because agitated Her husband will try to get these another day She also needs pneumonia vaccine  See After Visit Summary for Counseling Recommendations    1. Need for influenza vaccinationUnable to do vaccinations today due to agitation  Unable to do labs as well Colon, bmd, mammo---  Refused due to pt deteriorating condition  rto 6 mon or sooner prn  *  2. Hypothyroidism, unspecified type Unable to check labs today con't meds   3. Hyperlipidemia, unspecified hyperlipidemia type Encouraged heart healthy diet, increase exercise, avoid trans fats, consider a krill oil cap daily 4. Late onset Alzheimer's disease with behavioral disturbance (HCC) Worsening-- f/u neuro  5. Essential hypertension Well controlled, no changes to meds. Encouraged heart healthy diet such as the DASH diet and exercise as tolerated.   6. Preventative health care ghm --  Needs  pneum and flu---  Pt too agitated today to get vaccines or do labs  Colon, bmd and mamm---  Refused due to pt worsening condition and agitation Check labs  See AVS

## 2018-11-04 NOTE — Assessment & Plan Note (Signed)
con't meds  Check labs 

## 2018-11-04 NOTE — Patient Instructions (Signed)
Preventive Care 65 Years and Older, Female Preventive care refers to lifestyle choices and visits with your health care provider that can promote health and wellness. This includes:  A yearly physical exam. This is also called an annual well check.  Regular dental and eye exams.  Immunizations.  Screening for certain conditions.  Healthy lifestyle choices, such as diet and exercise. What can I expect for my preventive care visit? Physical exam Your health care provider will check:  Height and weight. These may be used to calculate body mass index (BMI), which is a measurement that tells if you are at a healthy weight.  Heart rate and blood pressure.  Your skin for abnormal spots. Counseling Your health care provider may ask you questions about:  Alcohol, tobacco, and drug use.  Emotional well-being.  Home and relationship well-being.  Sexual activity.  Eating habits.  History of falls.  Memory and ability to understand (cognition).  Work and work Statistician.  Pregnancy and menstrual history. What immunizations do I need?  Influenza (flu) vaccine  This is recommended every year. Tetanus, diphtheria, and pertussis (Tdap) vaccine  You may need a Td booster every 10 years. Varicella (chickenpox) vaccine  You may need this vaccine if you have not already been vaccinated. Zoster (shingles) vaccine  You may need this after age 65. Pneumococcal conjugate (PCV13) vaccine  One dose is recommended after age 65. Pneumococcal polysaccharide (PPSV23) vaccine  One dose is recommended after age 65. Measles, mumps, and rubella (MMR) vaccine  You may need at least one dose of MMR if you were born in 1957 or later. You may also need a second dose. Meningococcal conjugate (MenACWY) vaccine  You may need this if you have certain conditions. Hepatitis A vaccine  You may need this if you have certain conditions or if you travel or work in places where you may be exposed  to hepatitis A. Hepatitis B vaccine  You may need this if you have certain conditions or if you travel or work in places where you may be exposed to hepatitis B. Haemophilus influenzae type b (Hib) vaccine  You may need this if you have certain conditions. You may receive vaccines as individual doses or as more than one vaccine together in one shot (combination vaccines). Talk with your health care provider about the risks and benefits of combination vaccines. What tests do I need? Blood tests  Lipid and cholesterol levels. These may be checked every 5 years, or more frequently depending on your overall health.  Hepatitis C test.  Hepatitis B test. Screening  Lung cancer screening. You may have this screening every year starting at age 65 if you have a 30-pack-year history of smoking and currently smoke or have quit within the past 15 years.  Colorectal cancer screening. All adults should have this screening starting at age 65 and continuing until age 15. Your health care provider may recommend screening at age 23 if you are at increased risk. You will have tests every 1-10 years, depending on your results and the type of screening test.  Diabetes screening. This is done by checking your blood sugar (glucose) after you have not eaten for a while (fasting). You may have this done every 1-3 years.  Mammogram. This may be done every 1-2 years. Talk with your health care provider about how often you should have regular mammograms.  BRCA-related cancer screening. This may be done if you have a family history of breast, ovarian, tubal, or peritoneal cancers.  Other tests  Sexually transmitted disease (STD) testing.  Bone density scan. This is done to screen for osteoporosis. You may have this done starting at age 65. Follow these instructions at home: Eating and drinking  Eat a diet that includes fresh fruits and vegetables, whole grains, lean protein, and low-fat dairy products. Limit  your intake of foods with high amounts of sugar, saturated fats, and salt.  Take vitamin and mineral supplements as recommended by your health care provider.  Do not drink alcohol if your health care provider tells you not to drink.  If you drink alcohol: ? Limit how much you have to 0-1 drink a day. ? Be aware of how much alcohol is in your drink. In the [place] one drink equals one 12 oz bottle of beer (355 mL), one 5 oz glass of wine (148 mL), or one 1 oz glass of hard liquor (44 mL). Lifestyle  Take daily care of your teeth and gums.  Stay active. Exercise for at least 30 minutes on 5 or more days each week.  Do not use any products that contain nicotine or tobacco, such as cigarettes, e-cigarettes, and chewing tobacco. If you need help quitting, ask your health care provider.  If you are sexually active, practice safe sex. Use a condom or other form of protection in order to prevent STIs (sexually transmitted infections).  Talk with your health care provider about taking a low-dose aspirin or statin. What's next?  Go to your health care provider once a year for a well check visit.  Ask your health care provider how often you should have your eyes and teeth checked.  Stay up to date on all vaccines. This information is not intended to replace advice given to you by your health care provider. Make sure you discuss any questions you have with your health care provider. Document Released: 01/28/2015 Document Revised: 12/26/2017 Document Reviewed: 12/26/2017 Elsevier Patient Education  2020 [employer].

## 2018-11-04 NOTE — Assessment & Plan Note (Signed)
Encouraged heart healthy diet, increase exercise, avoid trans fats, consider a krill oil cap daily Check labs  

## 2018-11-04 NOTE — Assessment & Plan Note (Signed)
Unable to do vaccinations today due to agitation  Unable to do labs as well Colon, bmd, mammo---  Refused due to pt deteriorating condition  rto 6 mon or sooner prn

## 2018-11-04 NOTE — Assessment & Plan Note (Signed)
Worsening con't meds F/u neuro

## 2019-02-19 ENCOUNTER — Ambulatory Visit: Payer: Medicare PPO | Admitting: Neurology

## 2019-02-19 ENCOUNTER — Telehealth: Payer: Self-pay | Admitting: *Deleted

## 2019-02-19 ENCOUNTER — Encounter: Payer: Self-pay | Admitting: Neurology

## 2019-02-19 ENCOUNTER — Other Ambulatory Visit: Payer: Self-pay

## 2019-02-19 VITALS — BP 96/61 | HR 61 | Temp 96.9°F | Ht 62.0 in | Wt 136.0 lb

## 2019-02-19 DIAGNOSIS — F0391 Unspecified dementia with behavioral disturbance: Secondary | ICD-10-CM

## 2019-02-19 MED ORDER — ARIPIPRAZOLE 2 MG PO TABS: 4.0000 mg | ORAL_TABLET | Freq: Every day | ORAL | 11 refills | Status: AC

## 2019-02-19 NOTE — Progress Notes (Signed)
PATIENT: Jennifer Small DOB: 07/25/1955  REASON FOR VISIT: follow up HISTORY FROM: patient  HISTORY OF PRESENT ILLNESS: Today 02/19/19  HISTORY  HISTORY Menon is a 64 years old right-handed Caucasian female, accompanied by her husband, referred by her primary care physician Dr. Etter Sjogren for evaluation of memory loss since 2012.   She has past medical history of hypothyroidism, on supplement, hyperlipidemia, she had college degree, majored in Pharmacologist, she is a Photographer for [employer], Since 2012, her husband noticed she has confusion episode, confused day of the week, do not know how to set up a clock, anxious, losing confident easily, getting worse over the past year, Herself denied any significant difficulty, denied difficulty handling her job, but getting very frustrated easily during office visit. She was diagnosed with hypothyroidism in early 2013, I don't know exact pathology, she was started on thyroid supplement, husband reported mild improvement  There was no significant family history of dementia,  MRI of the brain has demonstrated mild atrophy On follow up visit, she tends to be very agitated, refuse MMSE exam. CSF showed TP 45, Glucose67, VDRL-, ACE0, OCB-, wbc1/rbc 0,  EEG showed frequent spkie-slow burst, increased apperance during photic stimulation. Hyperventilation also induced prolonged long, abnormal generalied slow, spike slow activites. I have started her on Depakote xr, she could not tolerate clonazapam, has excessive drowsy. She was evaluated by Dr Valentina Shaggy in Early 2013, confirmed global and severe cognitive deterioration marked by impairments in multiple cognitive domains, including orientation, attention, mental processing speed, immediate   Labs showed positive ANA, dsDNA, thyroid globulin antibody,elevated TSH 14.7, normal thyroid peroxidase, negative or normal CBC, CMP, HIV, Lyme, VitD, RPR, ESR, CPK, Vit B12, CRP. mildly  elevated LDL 116, triglyceride 189, normal CBC, vitamin B12 1474,   UPDATE June 2014: I started her on a trial of prednisone up to 20 mg 2 tablets for one to 2 weeks in April 2014, without significant improvement, she was also evaluated by her rheumatologist Dr. Lenn Sink during that period of time, I do not have the feedback, but per patient, there was no definite connective tissue disease, patient failed to respond to prednisone treatment, it was tapered under the guidance of Dr. Luan Pulling in 2-3 weeks.She denies gait difficulty, on disability now, but confused easily, only drive very short distance,   UPDATE Oct 14th 2014:She is about the same, she is functional at home, she cooks some, she is home with dog, take dog for a walk. She has quit driving, she does not want to go to [place] for second opinion  UPDATE July 14th 2015:Her husband retired, they are able to spend more time together, travels some, maintain the house, garden, she sleeps well, eats well, no vision difficulty no gait difficulty, no incontinence. She is taking Namenda, Aricept, Depakote ER 250 mg 2 tablets at night.  Update August 06 2016: I reviewed and summarized the consultation note from [place] in 2016, confirmed the diagnosis of dementia,  She continue to progress, she tends to more anxious, she tends to be easily frustrated, is taking Aricept 10 mg daily, Namenda 10 mg twice a day.   UPDATE August 07 2017: She continued to decline steadily, her husband has retired, spends most of his days taking care of her, she could not carry on a normal conversation, tends to be agitated during the daytime, pacing around.  She has no difficulty sleeping, but has daytime agitation.  Update February 18, 2018: She is accompanied by her  husband at today's clinical visit, she had steady decline, difficult to carry on a conversation, tends to become very agitated few hours during the day, screaming, clapping her hands so hard, she  developed callus at the palm of her hands, it caused a lot of stress to her husband as well  Xanax 0.25 mg twice a day as needed does calm her down for a while, then she had recurrent symptoms again, previously tried Seroquel, without significant improvement  She ambulate without difficulty, has good appetite, sleeping well most of the time  REVIEW OF SYSTEMS: Out of a complete 14 system review of symptoms, the patient complains only of the following symptoms, and all other reviewed systems are negative.  Memory loss, urinary incontinence  ALLERGIES: No Known Allergies  HOME MEDICATIONS: Outpatient Medications Prior to Visit  Medication Sig Dispense Refill  . ALPRAZolam (XANAX) 0.25 MG tablet Take 1 tablet (0.25 mg total) by mouth 2 (two) times daily as needed for anxiety (for agitation). 20 tablet 1  . atorvastatin (LIPITOR) 20 MG tablet TAKE 1 TABLET(20 MG) BY MOUTH AT BEDTIME 90 tablet 1  . donepezil (ARICEPT) 10 MG tablet TAKE 1 TABLET(10 MG) BY MOUTH DAILY 90 tablet 3  . fenofibrate 160 MG tablet TAKE 1 TABLET(160 MG) BY MOUTH DAILY 90 tablet 1  . memantine (NAMENDA) 10 MG tablet TAKE 1 TABLET(10 MG) BY MOUTH TWICE DAILY 180 tablet 3  . Multiple Vitamin (MULTIVITAMIN) tablet Take 1 tablet by mouth daily.      . raloxifene (EVISTA) 60 MG tablet TAKE 1 TABLET(60 MG) BY MOUTH DAILY 90 tablet 1  . SYNTHROID 112 MCG tablet TAKE 1 TABLET BY MOUTH EVERY MORNING BEFORE BREAKFAST 90 tablet 3   No facility-administered medications prior to visit.    PAST MEDICAL HISTORY: Past Medical History:  Diagnosis Date  . Dementia ([place])   . Hypothyroidism   . Memory loss   . Osteoporosis   . Thyroid disease    Hypothyroidism    PAST SURGICAL HISTORY: Past Surgical History:  Procedure Laterality Date  . BREAST LUMPECTOMY    . CESAREAN SECTION      FAMILY HISTORY: Family History  Problem Relation Age of Onset  . Alzheimer's disease Mother   . Arthritis Father   . Gallbladder disease  Father   . Down syndrome Brother   . Dementia Brother     SOCIAL HISTORY: Social History   Socioeconomic History  . Marital status: Married    Spouse name: [name]  . Number of children: 2  . Years of education: college  . Highest education level: Not on file  Occupational History    Comment: disability    Employer: [employer]  Tobacco Use  . Smoking status: Never Smoker  . Smokeless tobacco: Never Used  Substance and Sexual Activity  . Alcohol use: Yes    Alcohol/week: 1.0 standard drinks    Types: 1 drink(s) per week    Comment: rare  . Drug use: No  . Sexual activity: Yes    Partners: Male  Other Topics Concern  . Not on file  Social History Narrative   GETS REG EXERCISE--walking   Patient lives at home with her husband [name]. ).    Patient is on disability    Patient has college education.   Right handed.   Caffeine.- One or two cups daily.            Social Determinants of Health   Financial Resource Strain:   .  Difficulty of Paying Living Expenses: Not on file  Food Insecurity:   . Worried About Charity fundraiser in the Last Year: Not on file  . Ran Out of Food in the Last Year: Not on file  Transportation Needs:   . Lack of Transportation (Medical): Not on file  . Lack of Transportation (Non-Medical): Not on file  Physical Activity:   . Days of Exercise per Week: Not on file  . Minutes of Exercise per Session: Not on file  Stress:   . Feeling of Stress : Not on file  Social Connections:   . Frequency of Communication with Friends and Family: Not on file  . Frequency of Social Gatherings with Friends and Family: Not on file  . Attends Religious Services: Not on file  . Active Member of Clubs or Organizations: Not on file  . Attends Archivist Meetings: Not on file  . Marital Status: Not on file  Intimate Partner Violence:   . Fear of Current or Ex-Partner: Not on file  . Emotionally Abused: Not on file  . Physically Abused:  Not on file  . Sexually Abused: Not on file    PHYSICAL EXAM  Vitals:   02/19/19 1023  BP: 96/61  Pulse: 61  Temp: (!) 96.9 F (36.1 C)  Weight: 136 lb (61.7 kg)  Height: '5\' 2"'$  (5.449 m)   Body mass index is 24.87 kg/m.   PHYSICAL EXAMNIATION:  Gen: NAD, conversant, well nourised, well groomed                     Cardiovascular: Regular rate rhythm, no peripheral edema, warm, nontender. Eyes: Conjunctivae clear without exudates or hemorrhage Neck: Supple, no carotid bruits. Pulmonary: Clear to auscultation bilaterally   NEUROLOGICAL EXAM:  MENTAL STATUS: Speech/cognition:     She is awake during interview, but did not talk, rely on her husband to provide history, follow one-step command,   CRANIAL NERVES: CN II: Visual fields are full to confrontation.  Pupils are round equal and briskly reactive to light. CN III, IV, VI: extraocular movement are normal. No ptosis. CN V: Facial sensation is intact    CN VII: Face is symmetric with normal eye closure and smile. CN VIII: Hearing is normal to casual conversation CN IX, X: Palate elevates symmetrically. Phonation is normal. CN XI: Head turning and shoulder shrug are intact    MOTOR: Moving 4 extremities without difficulty  REFLEXES: Reflexes are 2+ and symmetric at the biceps, triceps, knees, and ankles. Plantar responses are flexor.  SENSORY: Withdrawal to pain  COORDINATION: No truncal ataxia or limb dysmetria noted  GAIT/STANCE: Get up from seated position without difficulty, fairly static gait    DIAGNOSTIC DATA (LABS, IMAGING, TESTING) - I reviewed patient records, labs, notes, testing and imaging myself where available.  Lab Results  Component Value Date   WBC 6.6 09/24/2016   HGB 14.8 09/24/2016   HCT 44.1 09/24/2016   MCV 94.1 09/24/2016   PLT 282.0 09/24/2016      Component Value Date/Time   NA 144 10/03/2017 0940   K 4.2 10/03/2017 0940   CL 107 10/03/2017 0940   CO2 30 10/03/2017  0940   GLUCOSE 99 10/03/2017 0940   BUN 14 10/03/2017 0940   CREATININE 0.83 10/03/2017 0940   CALCIUM 9.7 10/03/2017 0940   PROT 6.6 10/03/2017 0940   ALBUMIN 4.2 10/03/2017 0940   AST 21 10/03/2017 0940   ALT 19 10/03/2017 0940  ALKPHOS 58 10/03/2017 0940   BILITOT 0.6 10/03/2017 0940   GFRNONAA 86.21 05/24/2009 1003   Lab Results  Component Value Date   CHOL 147 10/03/2017   HDL 48.10 10/03/2017   LDLCALC 76 10/03/2017   LDLDIRECT 108.8 06/26/2012   TRIG 112.0 10/03/2017   CHOLHDL 3 10/03/2017   Lab Results  Component Value Date   HGBA1C 5.6 08/19/2014   Lab Results  Component Value Date   VITAMINB12 1,474 (H) 06/18/2011   Lab Results  Component Value Date   TSH 0.74 10/03/2017      ASSESSMENT AND PLAN 64 y.o. year old female   Dementia with Agitation  Progressively worse  Continue Aricept 10 mg daily, Namenda 10 mg twice a day  Previously tried Seroquel 25 mg, Xanax 0.25 mg as needed without helping her symptoms  Will try to add on Abilify 2 mg every day, titrating to 4 mg daily  Total time spent reviewing the chart, obtaining history, examined patient, ordering tests, documentation, consultations and family, care coordination was 30 minutes  Marcial Pacas, M.D. Ph.D.  [facility] [place], [place] [address] Phone: [phone] Fax:      [phone]

## 2019-02-19 NOTE — Telephone Encounter (Signed)
PA for Abilify 2mg , #60/month approved by [facility] [phone]) through 01/05/2020. Pt ID: 01/07/2020. Covermymeds key: [id number].

## 2019-02-20 ENCOUNTER — Encounter: Payer: Self-pay | Admitting: Neurology

## 2019-03-12 ENCOUNTER — Telehealth: Payer: Self-pay | Admitting: Neurology

## 2019-03-12 NOTE — Telephone Encounter (Signed)
Pt's husband Onalee Hua on [place] called needing to speak to RN or provider about the pt's reaction to her ARIPiprazole (ABILIFY) 2 MG tablet. Husband states that the medication has helped with the anger but now the pt is having incontinence. He started giving her 1 mg but that did not seem to help. Please advise.

## 2019-03-12 NOTE — Telephone Encounter (Signed)
She was having urinary incontinence 1-2 times per week. Since starting Ability, she is having it 1-2 times per day and he read on the internet that the medication my be causing this worsening. However, the it is helping with her anger issues. He is wondering if there is something else she can try.

## 2019-03-12 NOTE — Telephone Encounter (Signed)
I am not sure her worsening urinary incontinence is due to such a low dose of Abilify,  1.  He may check with her primary care to rule out UTI first 2.  The other options are Seroquel, Risperdal, may have similar side effect as Abilify

## 2019-03-12 NOTE — Telephone Encounter (Signed)
I have spoke to the patient's husband and he is agreeable to rule out a UTI with PCP first. He will call us back, if needed.

## 2019-03-13 ENCOUNTER — Encounter: Payer: Self-pay | Admitting: Family Medicine

## 2019-03-13 ENCOUNTER — Other Ambulatory Visit: Payer: Self-pay

## 2019-03-13 ENCOUNTER — Ambulatory Visit: Payer: Medicare PPO | Admitting: Family Medicine

## 2019-03-13 VITALS — BP 123/81 | HR 66 | Temp 98.0°F | Resp 16 | Ht 62.0 in | Wt 139.1 lb

## 2019-03-13 DIAGNOSIS — R32 Unspecified urinary incontinence: Secondary | ICD-10-CM

## 2019-03-13 MED ORDER — CEPHALEXIN 500 MG PO CAPS: 500.0000 mg | ORAL_CAPSULE | Freq: Three times a day (TID) | ORAL | 0 refills | Status: DC

## 2019-03-13 NOTE — Patient Instructions (Addendum)
I have called in an antibiotic to start to attempt to treat presumed UTI.  If she is able to produce a urine in the hat>> put in speciman container- refrigerate  And drop off Monday.    Hydrate!

## 2019-03-13 NOTE — Progress Notes (Signed)
This visit occurred during the SARS-CoV-2 public health emergency.  Safety protocols were in place, including screening questions prior to the visit, additional usage of staff PPE, and extensive cleaning of exam room while observing appropriate contact time as indicated for disinfecting solutions.    Jennifer Small , 09-30-1955, 64 y.o., female MRN: [id number] Patient Care Team    Relationship Specialty Notifications Start End  Carollee Herter, Alferd Ap[place], [place] PCP - General   12/28/09   Marcial Pacas, MD Consulting Physician Neurology  08/19/14   Gasper Lloyd, MD Referring Physician Neurology  08/19/14     Chief Complaint  Patient presents with  . Urinary Tract Infection    Over the past week or two she has been urinating in her pants. Urine darker in color and has odor. Complained earlier in the week of back pain.      Subjective: Jennifer Small is a 64 y.o. female Pt presents for an OV with complaints of urinary incontinence.  She presents today with her husband who is her caregiver.  Says he has severe dementia and is unable to partake in providing history.  Husband reports she was started recently on Abilify through neurology for her dementia associated agitation.  He reports the medicine is working great for her agitation and is helped a great deal but she has increasingly urinary incontinence since that time.  They deny any fever, chills complaints of nausea or vomiting.  He unfortunately will also not be able to obtain a urinary sample today in the office given her dementia.  Depression screen [place] 2/9 09/24/2016 09/09/2015 06/26/2012  Decreased Interest 0 0 0  Down, Depressed, Hopeless 0 0 0  PHQ - 2 Score 0 0 0    No Known Allergies Social History   Social History Narrative   GETS REG EXERCISE--walking   Patient lives at home with her husband [name]. ).    Patient is on disability    Patient has college education.   Right handed.   Caffeine.- One or two cups daily.            Past  Medical History:  Diagnosis Date  . Dementia ([place])   . Hypothyroidism   . Memory loss   . Osteoporosis   . Thyroid disease    Hypothyroidism   Past Surgical History:  Procedure Laterality Date  . BREAST LUMPECTOMY    . CESAREAN SECTION     Family History  Problem Relation Age of Onset  . Alzheimer's disease Mother   . Arthritis Father   . Gallbladder disease Father   . Down syndrome Brother   . Dementia Brother    Allergies as of 03/13/2019   No Known Allergies     Medication List       Accurate as of March 13, 2019 11:59 PM. If you have any questions, ask your nurse or doctor.        ALPRAZolam 0.25 MG tablet Commonly known as: XANAX Take 1 tablet (0.25 mg total) by mouth 2 (two) times daily as needed for anxiety (for agitation).   ARIPiprazole 2 MG tablet Commonly known as: Abilify Take 2 tablets (4 mg total) by mouth daily.   atorvastatin 20 MG tablet Commonly known as: LIPITOR TAKE 1 TABLET(20 MG) BY MOUTH AT BEDTIME   cephALEXin 500 MG capsule Commonly known as: KEFLEX Take 1 capsule (500 mg total) by mouth 3 (three) times daily. Started by: Howard Pouch, DO   donepezil 10 MG tablet  Commonly known as: ARICEPT TAKE 1 TABLET(10 MG) BY MOUTH DAILY   fenofibrate 160 MG tablet TAKE 1 TABLET(160 MG) BY MOUTH DAILY   memantine 10 MG tablet Commonly known as: NAMENDA TAKE 1 TABLET(10 MG) BY MOUTH TWICE DAILY   multivitamin tablet Take 1 tablet by mouth daily.   raloxifene 60 MG tablet Commonly known as: EVISTA TAKE 1 TABLET(60 MG) BY MOUTH DAILY   Synthroid 112 MCG tablet Generic drug: levothyroxine TAKE 1 TABLET BY MOUTH EVERY MORNING BEFORE BREAKFAST       All past medical history, surgical history, allergies, family history, immunizations andmedications were updated in the EMR today and reviewed under the history and medication portions of their EMR.     ROS: Negative, with the exception of above mentioned in HPI   Objective:  BP  123/81 (BP Location: Right Arm, Patient Position: Sitting, Cuff Size: Normal)   Pulse 66   Temp 98 F (36.7 C) (Temporal)   Resp 16   Ht 5\' 2"  (1.575 m)   Wt 139 lb 2 oz (63.1 kg)   SpO2 98%   BMI 25.45 kg/m  Body mass index is 25.45 kg/m. Gen: Afebrile. No acute distress. Nontoxic in appearance, well developed, well nourished.  HENT: AT. [place]. Eyes:Pupils Equal Round Reactive to light, Extraocular movements intact,  Conjunctiva without redness, discharge or icterus. Neuro:  Normal gait. PERLA. EOMi. Alert. Oriented x3  No exam data present No results found. No results found for this or any previous visit (from the past 24 hour(s)).  Assessment/Plan: Laguana Desautel is a 64 y.o. female present for OV for  Urinary incontinence: Difficult scenario given patient's dementia she is unable to provide any history or cooperate with exam. Uncertain if urinary incontinence is related to worsening dementia versus UTI versus medication side effect. Without the ability to test for UTI today, elected to treat prophylactically.  If urinary incontinence results could be secondary to UTI. Husband was also provided with a urinary hat/specimen cup that if able he could attempt to collect over the weekend. Keflex 3 times daily x7 days. Follow-up with PCP or neuro depending upon clinical outcome.  Reviewed expectations re: course of current medical issues.  Discussed self-management of symptoms.  Outlined signs and symptoms indicating need for more acute intervention.  Patient verbalized understanding and all questions were answered.  Patient received an After-Visit Summary.    Orders Placed This Encounter  Procedures  . Urinalysis w microscopic + reflex cultur   Meds ordered this encounter  Medications  . cephALEXin (KEFLEX) 500 MG capsule    Sig: Take 1 capsule (500 mg total) by mouth 3 (three) times daily.    Dispense:  21 capsule    Refill:  0   Referral Orders  No referral(s)  requested today     Note is dictated utilizing voice recognition software. Although note has been proof read prior to signing, occasional typographical errors still can be missed. If any questions arise, please do not hesitate to call for verification.   electronically signed by:  69, DO  [facility] - [place]

## 2019-03-16 ENCOUNTER — Encounter: Payer: Self-pay | Admitting: Family Medicine

## 2019-03-20 ENCOUNTER — Other Ambulatory Visit: Payer: Self-pay | Admitting: Family Medicine

## 2019-03-20 DIAGNOSIS — Z0000 Encounter for general adult medical examination without abnormal findings: Secondary | ICD-10-CM

## 2019-03-20 DIAGNOSIS — M8580 Other specified disorders of bone density and structure, unspecified site: Secondary | ICD-10-CM

## 2019-04-23 ENCOUNTER — Telehealth: Payer: Self-pay | Admitting: Neurology

## 2019-04-23 NOTE — Telephone Encounter (Signed)
Letter of necessity for nursing facility was written on April 8th 2021

## 2019-04-23 NOTE — Telephone Encounter (Signed)
Patient husband called to inform they are beginning the process for pt to go to a nursing home and states he is needing a letter/rating that pt needs skilled nursing .

## 2019-04-23 NOTE — Telephone Encounter (Addendum)
I spoke to her husband. He is aware the letter is ready. He would like it mailed to their home address (verified in chart). Placed in mail today.

## 2019-04-24 ENCOUNTER — Telehealth: Payer: Self-pay | Admitting: Family Medicine

## 2019-04-24 DIAGNOSIS — E785 Hyperlipidemia, unspecified: Secondary | ICD-10-CM

## 2019-04-24 DIAGNOSIS — Z0000 Encounter for general adult medical examination without abnormal findings: Secondary | ICD-10-CM

## 2019-04-24 DIAGNOSIS — E781 Pure hyperglyceridemia: Secondary | ICD-10-CM

## 2019-04-24 NOTE — Telephone Encounter (Signed)
Medication: fenofibrate 160 MG tablet  atorvastatin (LIPITOR) 20 MG tablet   Has the patient contacted their pharmacy? Yes.   (If no, request that the patient contact the pharmacy for the refill.)  (If yes, when and what did the pharmacy advise?)  He stated that they told that we declined meds   Preferred Pharmacy (with phone number or street name):  [id number] [place],  - [address] AT [facility] OF [address] & [address]  [address] [place] [address]  Phone:  [phone] Fax:  [phone]  Agent: Please be advised that RX refills may take up to 3 business days. We ask that you follow-up with your pharmacy.

## 2019-04-27 MED ORDER — ATORVASTATIN CALCIUM 20 MG PO TABS: ORAL_TABLET | ORAL | 1 refills | Status: DC

## 2019-04-27 MED ORDER — FENOFIBRATE 160 MG PO TABS: ORAL_TABLET | ORAL | 1 refills | Status: DC

## 2019-04-27 NOTE — Telephone Encounter (Signed)
Refills sent

## 2019-05-04 ENCOUNTER — Other Ambulatory Visit: Payer: Self-pay

## 2019-05-05 ENCOUNTER — Ambulatory Visit: Payer: Medicare PPO | Admitting: Family Medicine

## 2019-05-05 ENCOUNTER — Encounter: Payer: Self-pay | Admitting: Family Medicine

## 2019-05-05 ENCOUNTER — Other Ambulatory Visit: Payer: Self-pay

## 2019-05-05 DIAGNOSIS — E785 Hyperlipidemia, unspecified: Secondary | ICD-10-CM

## 2019-05-05 DIAGNOSIS — I10 Essential (primary) hypertension: Secondary | ICD-10-CM

## 2019-05-05 DIAGNOSIS — Z0000 Encounter for general adult medical examination without abnormal findings: Secondary | ICD-10-CM | POA: Diagnosis not present

## 2019-05-05 DIAGNOSIS — E039 Hypothyroidism, unspecified: Secondary | ICD-10-CM

## 2019-05-05 DIAGNOSIS — F0391 Unspecified dementia with behavioral disturbance: Secondary | ICD-10-CM

## 2019-05-05 DIAGNOSIS — M810 Age-related osteoporosis without current pathological fracture: Secondary | ICD-10-CM | POA: Diagnosis not present

## 2019-05-05 MED ORDER — SYNTHROID 112 MCG PO TABS: ORAL_TABLET | ORAL | 3 refills | Status: AC

## 2019-05-05 NOTE — Assessment & Plan Note (Signed)
F/u neuro con't aricept and namenda Fl 2 filled out

## 2019-05-05 NOTE — Assessment & Plan Note (Signed)
Well controlled, no changes to meds. Encouraged heart healthy diet such as the DASH diet and exercise as tolerated.  °

## 2019-05-05 NOTE — Progress Notes (Addendum)
Subjective:     Jennifer Small is a 64 y.o. female and is here for a comprehensive physical exam. The patient reports worsening dementia per her husband.Marland Kitchen  He is looking to get her into a skilled nursing facility with memory care ---- he needs an fl2 filled out  Pt is refusing to take most of her meds  Her husband is able to get the smaller pills in only.  She is not wanting to eat---is losing weight   Social History   Socioeconomic History  . Marital status: Married    Spouse name: [name]  . Number of children: 2  . Years of education: college  . Highest education level: Not on file  Occupational History    Comment: disability    Employer: [employer]  Tobacco Use  . Smoking status: Never Smoker  . Smokeless tobacco: Never Used  Substance and Sexual Activity  . Alcohol use: Yes    Alcohol/week: 1.0 standard drinks    Types: 1 drink(s) per week    Comment: rare  . Drug use: No  . Sexual activity: Yes    Partners: Male  Other Topics Concern  . Not on file  Social History Narrative   GETS REG EXERCISE--walking   Patient lives at home with her husband [name]. ).    Patient is on disability    Patient has college education.   Right handed.   Caffeine.- One or two cups daily.            Social Determinants of Health   Financial Resource Strain:   . Difficulty of Paying Living Expenses:   Food Insecurity:   . Worried About Charity fundraiser in the Last Year:   . Arboriculturist in the Last Year:   Transportation Needs:   . Film/video editor (Medical):   Marland Kitchen Lack of Transportation (Non-Medical):   Physical Activity:   . Days of Exercise per Week:   . Minutes of Exercise per Session:   Stress:   . Feeling of Stress :   Social Connections:   . Frequency of Communication with Friends and Family:   . Frequency of Social Gatherings with Friends and Family:   . Attends Religious Services:   . Active Member of Clubs or Organizations:   . Attends Theatre manager Meetings:   Marland Kitchen Marital Status:   Intimate Partner Violence:   . Fear of Current or Ex-Partner:   . Emotionally Abused:   Marland Kitchen Physically Abused:   . Sexually Abused:    Health Maintenance  Topic Date Due  . COVID-19 Vaccine (1) Never done  . PNA vac Low Risk Adult (1 of 2 - PCV13) 11/04/2019 (Originally 02/28/2018)  . INFLUENZA VACCINE  08/16/2019  . TETANUS/TDAP  08/18/2024  . DEXA SCAN  Completed  . Hepatitis C Screening  Completed    The following portions of the patient's history were reviewed and updated as appropriate:  She  has a past medical history of Dementia ([place]), Hypothyroidism, Memory loss, Osteoporosis, and Thyroid disease. She does not have any pertinent problems on file. She  has a past surgical history that includes Cesarean section and Breast lumpectomy. Her family history includes Alzheimer's disease in her mother; Arthritis in her father; Dementia in her brother; Down syndrome in her brother; Gallbladder disease in her father. She  reports that she has never smoked. She has never used smokeless tobacco. She reports current alcohol use of about 1.0 standard drinks of  alcohol per week. She reports that she does not use drugs. She has a current medication list which includes the following prescription(s): aripiprazole, donepezil, memantine, multivitamin, and synthroid. Current Outpatient Medications on File Prior to Visit  Medication Sig Dispense Refill  . ARIPiprazole (ABILIFY) 2 MG tablet Take 2 tablets (4 mg total) by mouth daily. 60 tablet 11  . donepezil (ARICEPT) 10 MG tablet TAKE 1 TABLET(10 MG) BY MOUTH DAILY 90 tablet 3  . memantine (NAMENDA) 10 MG tablet TAKE 1 TABLET(10 MG) BY MOUTH TWICE DAILY 180 tablet 3  . Multiple Vitamin (MULTIVITAMIN) tablet Take 1 tablet by mouth daily.       No current facility-administered medications on file prior to visit.   She has No Known Allergies..  Review of Systems   Objective:    Temp (!) 97.2 F (36.2  C) (Temporal)   Resp 18   Ht 5\' 2"  (1.575 m)   Wt 129 lb 12.8 oz (58.9 kg)   BMI 23.74 kg/m  General appearance: appears older than stated age and uncooperative Lungs: clear to auscultation bilaterally Heart: regular rate and rhythm, S1, S2 normal, no murmur, click, rub or gallop  Pt uncooperative with physical exam Pushed me away Assessment:    Healthy female exam.      Plan:     See After Visit Summary for Counseling Recommendations    1. Preventative health care Unable to do exam  - SYNTHROID 112 MCG tablet; 1 po qd  Dispense: 90 tablet; Refill: 3  2. Hypothyroidism, unspecified type .unable to check labs  - SYNTHROID 112 MCG tablet; 1 po qd  Dispense: 90 tablet; Refill: 3  3. Dementia with behavioral disturbance, unspecified dementia type (HCC) Per neuro FL 2 filled out Pt needs transfer chair due to inc dementia and difficulty walking and outburst a at times she also needs 3 in 1 toilet chair  4. Essential hypertension Well controlled, no changes to meds. Encouraged heart healthy diet such as the DASH diet and exercise as tolerated.   5. Hyperlipidemia, unspecified hyperlipidemia type Encouraged heart healthy diet, increase exercise, avoid trans fats, consider a krill oil cap daily Stop statin and fenofibrate   6. Osteoporosis, unspecified osteoporosis type, unspecified pathological fracture presence Unable to get bmd Stop evista due to pt refusing meds    The patient suffers from dementia that is worsening.  This impairs her ability to perform daily activities like dressing, grooming bathing in the home.  A cane , walker or crutch will not resolve the issues.  A transport chair will allow the patient to safely perform daily activities.  Patient is unable to use a standard WC on their own and has a caregiver who is available and willing and able to provide assistance with the transport chair.

## 2019-05-05 NOTE — Assessment & Plan Note (Signed)
Due to weight loss and being unable to check labs  Will d/c statin and fenofibrate

## 2019-05-05 NOTE — Assessment & Plan Note (Signed)
con't synthroid Unable to get labs --- pt not cooperative with lab draw

## 2019-05-05 NOTE — Patient Instructions (Signed)
Dementia Caregiver Guide Dementia is a term used to describe a number of symptoms that affect memory and thinking. The most common symptoms include:  Memory loss.  Trouble with language and communication.  Trouble concentrating.  Poor judgment.  Problems with reasoning.  Child-like behavior and language.  Extreme anxiety.  Angry outbursts.  Wandering from home or public places. Dementia usually gets worse slowly over time. In the early stages, people with dementia can stay independent and safe with some help. In later stages, they need help with daily tasks such as dressing, grooming, and using the bathroom. How to help the person with dementia cope Dementia can be frightening and confusing. Here are some tips to help the person with dementia cope with changes caused by the disease. General tips  Keep the person on track with his or her routine.  Try to identify areas where the person may need help.  Be supportive, patient, calm, and encouraging.  Gently remind the person that adjusting to changes takes time.  Help with the tasks that the person has asked for help with.  Keep the person involved in daily tasks and decisions as much as possible.  Encourage conversation, but try not to get frustrated or harried if the person struggles to find words or does not seem to appreciate your help. Communication tips  When the person is talking or seems frustrated, make eye contact and hold the person's hand.  Ask specific questions that need yes or no answers.  Use simple words, short sentences, and a calm voice. Only give one direction at a time.  When offering choices, limit them to just 1 or 2.  Avoid correcting the person in a negative way.  If the person is struggling to find the right words, gently try to help him or her. How to recognize symptoms of stress Symptoms of stress in caregivers include:  Feeling frustrated or angry with the person with  dementia.  Denying that the person has dementia or that his or her symptoms will not improve.  Feeling hopeless and unappreciated.  Difficulty sleeping.  Difficulty concentrating.  Feeling anxious, irritable, or depressed.  Developing stress-related health problems.  Feeling like you have too little time for your own life. Follow these instructions at home:   Make sure that you and the person you are caring for: ? Get regular sleep. ? Exercise regularly. ? Eat regular, nutritious meals. ? Drink enough fluid to keep your urine clear or pale yellow. ? Take over-the-counter and prescription medicines only as told by your health care providers. ? Attend all scheduled health care appointments.  Join a support group with others who are caregivers.  Ask about respite care resources so that you can have a regular break from the stress of caregiving.  Look for signs of stress in yourself and in the person you are caring for. If you notice signs of stress, take steps to manage it.  Consider any safety risks and take steps to avoid them.  Organize medications in a pill box for each day of the week.  Create a plan to handle any legal or financial matters. Get legal or financial advice if needed.  Keep a calendar in a central location to remind the person of appointments or other activities. Tips for reducing the risk of injury  Keep floors clear of clutter. Remove rugs, magazine racks, and floor lamps.  Keep hallways well lit, especially at night.  Put a handrail and nonslip mat in the bathtub or   shower.  Put childproof locks on cabinets that contain dangerous items, such as medicines, alcohol, guns, toxic cleaning items, sharp tools or utensils, matches, and lighters.  Put the locks in places where the person cannot see or reach them easily. This will help ensure that the person does not wander out of the house and get lost.  Be prepared for emergencies. Keep a list of  emergency phone numbers and addresses in a convenient area.  Remove car keys and lock garage doors so that the person does not try to get in the car and drive.  Have the person wear a bracelet that tracks locations and identifies the person as having memory problems. This should be worn at all times for safety. Where to find support: Many individuals and organizations offer support. These include:  Support groups for people with dementia and for caregivers.  Counselors or therapists.  Home health care services.  Adult day care centers. Where to find more information Alzheimer's Association: www.alz.org Contact a health care provider if:  The person's health is rapidly getting worse.  You are no longer able to care for the person.  Caring for the person is affecting your physical and emotional health.  The person threatens himself or herself, you, or anyone else. Summary  Dementia is a term used to describe a number of symptoms that affect memory and thinking.  Dementia usually gets worse slowly over time.  Take steps to reduce the person's risk of injury, and to plan for future care.  Caregivers need support, relief from caregiving, and time for their own lives. This information is not intended to replace advice given to you by your health care provider. Make sure you discuss any questions you have with your health care provider. Document Revised: 12/14/2016 Document Reviewed: 12/06/2015 Elsevier Patient Education  2020 Elsevier Inc.  

## 2019-05-05 NOTE — Assessment & Plan Note (Signed)
Unable to do bmd Stop evista ---  Due to pt refusing to take her meds

## 2019-05-25 ENCOUNTER — Telehealth: Payer: Self-pay

## 2019-05-25 NOTE — Telephone Encounter (Signed)
Patient husband called in to report to Dr. Laury Axon to let her know that the patient is barley eating solid foods. Patient is drinking some liquids. Patient husband is very concerned and needs to be advised on what to do. Please call Kathelene Rumberger at [phone]

## 2019-05-25 NOTE — Telephone Encounter (Signed)
Please advise 

## 2019-05-25 NOTE — Telephone Encounter (Signed)
It sounds like the dementia is progressing  We can try meds but if she is not taking anything by mouth i'm not sure that will work  He can take her to the ER for eval and ivf-- if she needs admission they can get SW involved to help with placement We discussed this at her last appointment

## 2019-05-27 NOTE — Telephone Encounter (Signed)
Spoke with patient's husband. Husband states patient finally made a bowel movement after x5 days. He states patient is eating more soft food since bowel movement and is still making urine. He states he has been giving patient applesauce, yogurt, any food that she is willing to eat. He states that he has started some applications for some facilities for placement. Advised her husband if patient stops eating or stops making urine then she needs to be seen at the ED for IV fluids. I advised Ensure, Gatoride for hydration and calories. Please advise

## 2019-05-28 ENCOUNTER — Other Ambulatory Visit: Payer: Self-pay | Admitting: Family Medicine

## 2019-05-28 DIAGNOSIS — F02818 Dementia in other diseases classified elsewhere, unspecified severity, with other behavioral disturbance: Secondary | ICD-10-CM

## 2019-05-28 NOTE — Telephone Encounter (Signed)
Agree 

## 2019-05-28 NOTE — Telephone Encounter (Signed)
Patient husband Onalee Hua called today stating he is trying to get help from hospice however in order for it to take place he  Needs a letter from [facility] stating patient has less than 6 months to live.  Please contact Avanell Shackleton at [phone].

## 2019-05-28 NOTE — Telephone Encounter (Signed)
I placed a referral for [facility]

## 2019-05-28 NOTE — Telephone Encounter (Signed)
Please advise 

## 2019-05-29 NOTE — Telephone Encounter (Signed)
Caller : Drenda Freeze Call Back # [phone]  Drenda Freeze from Hospice would like to know if [facility] will be the ATTENDING MD for patient under hospice care ?    Please Advise

## 2019-05-29 NOTE — Telephone Encounter (Signed)
Please advise 

## 2019-05-29 NOTE — Telephone Encounter (Signed)
yes

## 2019-06-01 NOTE — Telephone Encounter (Signed)
Thank you :)

## 2019-06-01 NOTE — Telephone Encounter (Signed)
Spoke with hospice. They will going to see patient on Wednesday

## 2019-06-03 NOTE — Telephone Encounter (Signed)
[facility]  Call Back # [phone]  Jennifer Small, with  [facility], went to evaluable patient this morning. Patient is not eligible for hospice care but is eligible for Palliative care.

## 2019-06-03 NOTE — Telephone Encounter (Signed)
Please advise 

## 2019-06-03 NOTE — Telephone Encounter (Signed)
Will they change it or do I need to put in a new referral

## 2019-06-04 ENCOUNTER — Telehealth: Payer: Self-pay | Admitting: Family Medicine

## 2019-06-04 NOTE — Telephone Encounter (Signed)
Okay for order?

## 2019-06-04 NOTE — Telephone Encounter (Signed)
Contacted pt's husband, he is aware of everything. Someone came out today and explained how the program "[facility]" works. Left message on Fran's secure VM advising PCP had given approval for change to palliative care. Office number provided if any further questions.

## 2019-06-04 NOTE — Telephone Encounter (Signed)
Caller: [employer]  Call Back # [phone]  Per Nicholos Johns, pt admitted to there care.  Patient needs an order for medical equipment   1. Bedside toilet 3 in 1 2. Transfer wheelchair   equipment needs to be delivered to patient's home.

## 2019-06-04 NOTE — Telephone Encounter (Signed)
Contacted [facility], spoke with Drenda Freeze who confirmed they can change it in their system. They are just needing your approval for change to palliative care instead. CB # [phone]  Okay for this?

## 2019-06-04 NOTE — Telephone Encounter (Signed)
That is fine with me --- is her husband aware?

## 2019-06-04 NOTE — Telephone Encounter (Signed)
Yes - thank you

## 2019-06-05 MED ORDER — WHEELCHAIR MISC: 0 refills | Status: AC

## 2019-06-05 MED ORDER — 3-IN-1 BEDSIDE TOILET MISC: 0 refills | Status: AC

## 2019-06-05 NOTE — Telephone Encounter (Signed)
LMOM for Nicholos Johns w/ verbal orders.

## 2019-06-05 NOTE — Addendum Note (Signed)
Addended byConrad Snover D on: 06/05/2019 02:23 PM   Modules accepted: Orders

## 2019-06-05 NOTE — Telephone Encounter (Signed)
Jennifer Small called back- needs orders/scripts faxed to [facility] at [phone].

## 2019-06-11 ENCOUNTER — Other Ambulatory Visit: Payer: Self-pay | Admitting: Family Medicine

## 2019-06-11 ENCOUNTER — Telehealth: Payer: Self-pay

## 2019-06-11 DIAGNOSIS — F0391 Unspecified dementia with behavioral disturbance: Secondary | ICD-10-CM

## 2019-06-11 MED ORDER — NONFORMULARY OR COMPOUNDED ITEM: 0 refills | Status: AC

## 2019-06-11 NOTE — Telephone Encounter (Signed)
I though we did this for her She can have a transfer chair

## 2019-06-11 NOTE — Telephone Encounter (Signed)
Please advise 

## 2019-06-11 NOTE — Telephone Encounter (Signed)
French Ana patient's nurse  from [facility] called in to see if Dr. Laury Axon can put in an order for a transfer chair for the patient. If there are any questions please call French Ana back at [phone]

## 2019-06-12 NOTE — Telephone Encounter (Signed)
Order faxed to [facility] [phone]

## 2019-06-18 ENCOUNTER — Telehealth: Payer: Self-pay | Admitting: Family Medicine

## 2019-06-18 NOTE — Telephone Encounter (Signed)
Caller: [facility] Call Back # [phone]  French Ana states that patient is unable to walk, weak and loud odor in urine . French Ana is requesting medication for UTI. (something to crush up ). Patient has a hard swallowing.   Please Advise

## 2019-06-19 NOTE — Telephone Encounter (Signed)
FYI. Called [facility] back. Advised we need a UA and culture for treatment. Manger Tammy said they will go out to the patients house today for labs.

## 2019-06-19 NOTE — Telephone Encounter (Signed)
Agree But since it is Friday I can call in med for uti if needed

## 2019-06-24 ENCOUNTER — Telehealth: Payer: Self-pay | Admitting: Family Medicine

## 2019-06-24 DIAGNOSIS — M6281 Muscle weakness (generalized): Secondary | ICD-10-CM | POA: Diagnosis not present

## 2019-06-24 NOTE — Telephone Encounter (Signed)
Caller: Raynelle Fanning ([facility]) Call back phone number: [phone]  Raynelle Fanning is requesting a call back in regards to patient not having a bowel movement since last Thursday.

## 2019-06-24 NOTE — Telephone Encounter (Signed)
Miralax? OV? Please advise

## 2019-06-24 NOTE — Telephone Encounter (Signed)
Caller: Raynelle Fanning ([facility]) Call back phone number: [phone]   Raynelle Fanning call stating patient is now eligible for Hospice and she would like to confirm Dr. Laury Axon will be the attending doctor?

## 2019-06-25 NOTE — Telephone Encounter (Signed)
ok 

## 2019-06-26 NOTE — Telephone Encounter (Signed)
I called Jennifer Small back and advise that Dr. Laury Axon will stay on as attending if the family is okay with it.

## 2019-07-16 DEATH — deceased

## 2019-08-18 ENCOUNTER — Ambulatory Visit: Payer: Medicare PPO | Admitting: Neurology

## 2019-10-29 ENCOUNTER — Telehealth: Payer: Self-pay | Admitting: Family Medicine

## 2019-10-29 NOTE — Telephone Encounter (Signed)
Noted; PCP made aware 

## 2019-10-29 NOTE — Telephone Encounter (Signed)
Caller name: Onalee Hua Call back [phone]  Husband states patient passed on 2019-07-12.

## 2019-11-03 ENCOUNTER — Encounter: Payer: Medicare PPO | Admitting: Family Medicine
